# Patient Record
Sex: Male | Born: 1935
Health system: Southern US, Community
[De-identification: ages and names within clinical notes are randomized; demographics above are authoritative.]

## PROBLEM LIST (undated history)

## (undated) DIAGNOSIS — E7800 Pure hypercholesterolemia, unspecified: Secondary | ICD-10-CM

## (undated) DIAGNOSIS — G5603 Carpal tunnel syndrome, bilateral upper limbs: Principal | ICD-10-CM

## (undated) DIAGNOSIS — H409 Unspecified glaucoma: Secondary | ICD-10-CM

## (undated) DIAGNOSIS — K219 Gastro-esophageal reflux disease without esophagitis: Secondary | ICD-10-CM

## (undated) HISTORY — DX: Pure hypercholesterolemia, unspecified: E78.00

## (undated) HISTORY — DX: Carpal tunnel syndrome, bilateral upper limbs: G56.03

## (undated) HISTORY — PX: CATARACT EXTRACTION: SUR2

---

## 1997-09-20 ENCOUNTER — Ambulatory Visit (HOSPITAL_COMMUNITY): Admission: RE | Admit: 1997-09-20 | Discharge: 1997-09-20 | Payer: Self-pay | Admitting: *Deleted

## 1997-12-24 ENCOUNTER — Encounter: Payer: Self-pay | Admitting: Emergency Medicine

## 1997-12-24 ENCOUNTER — Emergency Department (HOSPITAL_COMMUNITY): Admission: EM | Admit: 1997-12-24 | Discharge: 1997-12-24 | Payer: Self-pay | Admitting: Emergency Medicine

## 1999-10-03 ENCOUNTER — Inpatient Hospital Stay (HOSPITAL_COMMUNITY): Admission: EM | Admit: 1999-10-03 | Discharge: 1999-10-08 | Payer: Self-pay

## 1999-10-03 ENCOUNTER — Encounter: Payer: Self-pay | Admitting: General Surgery

## 1999-10-04 ENCOUNTER — Encounter: Payer: Self-pay | Admitting: General Surgery

## 1999-10-05 ENCOUNTER — Encounter: Payer: Self-pay | Admitting: General Surgery

## 2001-05-11 ENCOUNTER — Encounter: Payer: Self-pay | Admitting: Occupational Medicine

## 2001-05-11 ENCOUNTER — Encounter: Admission: RE | Admit: 2001-05-11 | Discharge: 2001-05-11 | Payer: Self-pay | Admitting: Occupational Medicine

## 2001-12-14 ENCOUNTER — Encounter: Payer: Self-pay | Admitting: Orthopedic Surgery

## 2001-12-14 ENCOUNTER — Encounter: Admission: RE | Admit: 2001-12-14 | Discharge: 2001-12-14 | Payer: Self-pay | Admitting: Orthopedic Surgery

## 2003-04-16 ENCOUNTER — Ambulatory Visit (HOSPITAL_COMMUNITY): Admission: RE | Admit: 2003-04-16 | Discharge: 2003-04-16 | Payer: Self-pay | Admitting: Gastroenterology

## 2004-07-21 ENCOUNTER — Encounter: Admission: RE | Admit: 2004-07-21 | Discharge: 2004-07-21 | Payer: Self-pay | Admitting: Internal Medicine

## 2004-09-02 ENCOUNTER — Ambulatory Visit (HOSPITAL_COMMUNITY): Admission: RE | Admit: 2004-09-02 | Discharge: 2004-09-02 | Payer: Self-pay | Admitting: Gastroenterology

## 2004-10-10 ENCOUNTER — Emergency Department: Payer: Self-pay | Admitting: Emergency Medicine

## 2004-11-24 ENCOUNTER — Encounter: Admission: RE | Admit: 2004-11-24 | Discharge: 2004-11-24 | Payer: Self-pay | Admitting: Internal Medicine

## 2005-09-02 ENCOUNTER — Encounter: Admission: RE | Admit: 2005-09-02 | Discharge: 2005-09-02 | Payer: Self-pay | Admitting: Internal Medicine

## 2011-10-10 ENCOUNTER — Encounter (HOSPITAL_COMMUNITY): Payer: Self-pay | Admitting: Emergency Medicine

## 2011-10-10 ENCOUNTER — Emergency Department (HOSPITAL_COMMUNITY)
Admission: EM | Admit: 2011-10-10 | Discharge: 2011-10-11 | Disposition: A | Discharge status: Home / Self Care | Payer: Medicare Other | Attending: Emergency Medicine | Admitting: Emergency Medicine

## 2011-10-10 DIAGNOSIS — H409 Unspecified glaucoma: Secondary | ICD-10-CM | POA: Insufficient documentation

## 2011-10-10 DIAGNOSIS — K219 Gastro-esophageal reflux disease without esophagitis: Secondary | ICD-10-CM | POA: Insufficient documentation

## 2011-10-10 DIAGNOSIS — J029 Acute pharyngitis, unspecified: Secondary | ICD-10-CM | POA: Insufficient documentation

## 2011-10-10 DIAGNOSIS — J36 Peritonsillar abscess: Secondary | ICD-10-CM | POA: Insufficient documentation

## 2011-10-10 DIAGNOSIS — R509 Fever, unspecified: Secondary | ICD-10-CM | POA: Insufficient documentation

## 2011-10-10 HISTORY — DX: Gastro-esophageal reflux disease without esophagitis: K21.9

## 2011-10-10 HISTORY — DX: Unspecified glaucoma: H40.9

## 2011-10-10 LAB — RAPID STREP SCREEN (MED CTR MEBANE ONLY): Streptococcus, Group A Screen (Direct): NEGATIVE

## 2011-10-10 NOTE — ED Notes (Addendum)
Pt reporting mucus in back of throat and difficulty talking; reports pain for several days; denies nasal congestion or coughing. Reports he has been running fevers and had some headaches; denies SOB, chest pain. Glands swollen and tender.

## 2011-10-11 DIAGNOSIS — J36 Peritonsillar abscess: Secondary | ICD-10-CM | POA: Diagnosis present

## 2011-10-11 LAB — POCT I-STAT, CHEM 8
BUN: 9 mg/dL (ref 6–23)
Calcium, Ion: 1.2 mmol/L (ref 1.13–1.30)
Chloride: 102 mEq/L (ref 96–112)
HCT: 42 % (ref 39.0–52.0)
Sodium: 137 mEq/L (ref 135–145)
TCO2: 26 mmol/L (ref 0–100)

## 2011-10-11 LAB — CBC WITH DIFFERENTIAL/PLATELET
Basophils Absolute: 0 10*3/uL (ref 0.0–0.1)
Basophils Relative: 0 % (ref 0–1)
Lymphocytes Relative: 16 % (ref 12–46)
MCHC: 33 g/dL (ref 30.0–36.0)
Monocytes Absolute: 0.6 10*3/uL (ref 0.1–1.0)
Neutro Abs: 9.1 10*3/uL — ABNORMAL HIGH (ref 1.7–7.7)
Platelets: 159 10*3/uL (ref 150–400)
RDW: 11.4 % — ABNORMAL LOW (ref 11.5–15.5)
WBC: 11.5 10*3/uL — ABNORMAL HIGH (ref 4.0–10.5)

## 2011-10-11 MED ORDER — FENTANYL CITRATE 0.05 MG/ML IJ SOLN
50.0000 ug | Freq: Once | INTRAMUSCULAR | Status: AC
  Administered 2011-10-11: 50 ug via INTRAVENOUS
  Filled 2011-10-11: qty 2

## 2011-10-11 MED ORDER — AMOXICILLIN-POT CLAVULANATE 250-62.5 MG/5ML PO SUSR: 10.0000 mL | Freq: Two times a day (BID) | ORAL | Status: DC

## 2011-10-11 MED ORDER — CLINDAMYCIN PHOSPHATE 600 MG/50ML IV SOLN
600.0000 mg | Freq: Once | INTRAVENOUS | Status: AC
  Administered 2011-10-11: 600 mg via INTRAVENOUS
  Filled 2011-10-11: qty 50

## 2011-10-11 MED ORDER — DEXAMETHASONE SODIUM PHOSPHATE 4 MG/ML IJ SOLN
4.0000 mg | Freq: Once | INTRAMUSCULAR | Status: AC
  Administered 2011-10-11: 4 mg via INTRAVENOUS
  Filled 2011-10-11: qty 1

## 2011-10-11 MED ORDER — SODIUM CHLORIDE 0.9 % IV BOLUS (SEPSIS)
500.0000 mL | Freq: Once | INTRAVENOUS | Status: AC
  Administered 2011-10-11: 500 mL via INTRAVENOUS

## 2011-10-11 MED ORDER — HYDROCODONE-ACETAMINOPHEN 7.5-500 MG/15ML PO SOLN: ORAL | Status: DC

## 2011-10-11 NOTE — ED Provider Notes (Signed)
History     CSN: [id number]  Arrival date & time 10/10/11  2307   First MD Initiated Contact with Patient 10/11/11 0130      Chief Complaint  Patient presents with  . Sore Throat    (Consider location/radiation/quality/duration/timing/severity/associated sxs/prior treatment) Patient is a 78 y.o. male presenting with pharyngitis. The history is provided by the patient.  Sore Throat Pertinent negatives include no chest pain, no abdominal pain and no shortness of breath.   patient presents with sore throat and fever for the last few days. Worse on the left side. Worse with swallowing. No clear sick contacts. No cough. She's had some trouble eating due to the fever. He's not had pain like this before. No trouble breathing.  Past Medical History  Diagnosis Date  . GERD (gastroesophageal reflux disease)   . Glaucoma     No past surgical history on file.  No family history on file.  History  Substance Use Topics  . Smoking status: Not on file  . Smokeless tobacco: Not on file  . Alcohol Use:       Review of Systems  Constitutional: Positive for fever and fatigue. Negative for appetite change.  HENT: Positive for sore throat and trouble swallowing. Negative for drooling, dental problem and voice change.   Respiratory: Negative for cough and shortness of breath.   Cardiovascular: Negative for chest pain.  Gastrointestinal: Positive for nausea. Negative for vomiting and abdominal pain.  Genitourinary: Negative for flank pain.  Musculoskeletal: Negative for back pain.    Allergies  Review of patient's allergies indicates no known allergies.  Home Medications   Current Outpatient Rx  Name Route Sig Dispense Refill  . PANTOPRAZOLE SODIUM 40 MG PO TBEC Oral Take 40 mg by mouth daily.    Ronney Asters SINUS CONGESTION/PAIN PO Oral Take 2 tablets by mouth 2 (two) times daily as needed. For cold symptoms      BP 123/64  Pulse 85  Temp 99.4 F (37.4 C) (Oral)  Resp 20  Ht  5\' 5"  (1.651 m)  Wt 145 lb (65.772 kg)  BMI 24.13 kg/m2  SpO2 97%  Physical Exam  Constitutional: He appears well-developed and well-nourished.  HENT:  Head: Normocephalic.       Left-sided peritonsillar swelling. Likely peritonsillar abscess. Uvula midline. No tonsillar exudate.  Eyes: Pupils are equal, round, and reactive to light.  Neck: No tracheal deviation present.  Cardiovascular: Normal rate.   Pulmonary/Chest: Effort normal and breath sounds normal.  Abdominal: Soft. There is no tenderness.  Lymphadenopathy:    He has cervical adenopathy.  Neurological: He is alert.    ED Course  Procedures (including critical care time)  Labs Reviewed  CBC WITH DIFFERENTIAL - Abnormal; Notable for the following:    WBC 11.5 (*)     RDW 11.4 (*)     Neutrophils Relative 78 (*)     Neutro Abs 9.1 (*)     All other components within normal limits  POCT I-STAT, CHEM 8 - Abnormal; Notable for the following:    Glucose, Bld 121 (*)     All other components within normal limits  RAPID STREP SCREEN   No results found.   1. Peritonsillar abscess       MDM  Patient with apparent peritonsillar abscess. Given clindamycin and steroids. Discussion with Dr. Annalee Genta. He'll see the patient in ER tomorrow morning and likely drain the abscess.        Juliet Rude. Rubin Payor, MD 10/11/11 [phone]

## 2011-10-11 NOTE — ED Notes (Signed)
Dr. Annalee Genta called RN, stated he was on the way to see patient.

## 2011-10-11 NOTE — Consult Note (Signed)
ENT CONSULT:  Reason for Consult:Sore throat Referring Physician: EDP  LAKEITH CAREAGA is an 78 y.o. male.  HPI: 3 day hx of prog ST, poor po intake and fever, no sig hx of tonsillitis  Past Medical History  Diagnosis Date  . GERD (gastroesophageal reflux disease)   . Glaucoma     No past surgical history on file.  No family history on file.  Social History:  does not have a smoking history on file. He does not have any smokeless tobacco history on file. His alcohol and drug histories not on file.  Allergies: No Known Allergies  Medications: I have reviewed the patient's current medications.  Results for orders placed during the hospital encounter of 10/10/11 (from the past 48 hour(s))  RAPID STREP SCREEN     Status: Normal   Collection Time   10/10/11 11:35 PM      Component Value Range Comment   Streptococcus, Group A Screen (Direct) NEGATIVE  NEGATIVE   CBC WITH DIFFERENTIAL     Status: Abnormal   Collection Time   10/11/11  2:29 AM      Component Value Range Comment   WBC 11.5 (*) 4.0 - 10.5 K/uL    RBC 4.26  4.22 - 5.81 MIL/uL    Hemoglobin 13.5  13.0 - 17.0 g/dL    HCT 16.1  09.6 - 04.5 %    MCV 96.0  78.0 - 100.0 fL    MCH 31.7  26.0 - 34.0 pg    MCHC 33.0  30.0 - 36.0 g/dL    RDW 40.9 (*) 81.1 - 15.5 %    Platelets 159  150 - 400 K/uL    Neutrophils Relative 78 (*) 43 - 77 %    Neutro Abs 9.1 (*) 1.7 - 7.7 K/uL    Lymphocytes Relative 16  12 - 46 %    Lymphs Abs 1.8  0.7 - 4.0 K/uL    Monocytes Relative 6  3 - 12 %    Monocytes Absolute 0.6  0.1 - 1.0 K/uL    Eosinophils Relative 0  0 - 5 %    Eosinophils Absolute 0.0  0.0 - 0.7 K/uL    Basophils Relative 0  0 - 1 %    Basophils Absolute 0.0  0.0 - 0.1 K/uL   POCT I-STAT, CHEM 8     Status: Abnormal   Collection Time   10/11/11  2:48 AM      Component Value Range Comment   Sodium 137  135 - 145 mEq/L    Potassium 4.5  3.5 - 5.1 mEq/L    Chloride 102  96 - 112 mEq/L    BUN 9  6 - 23 mg/dL    Creatinine, Ser 9.14  0.50 - 1.35 mg/dL    Glucose, Bld 782 (*) 70 - 99 mg/dL    Calcium, Ion 9.56  2.13 - 1.30 mmol/L    TCO2 26  0 - 100 mmol/L    Hemoglobin 14.3  13.0 - 17.0 g/dL    HCT 08.6  57.8 - 46.9 %     No results found.  ROS:per Adm    Blood pressure 123/64, pulse 85, temperature 99.4 F (37.4 C), temperature source Oral, resp. rate 20, height 5\' 5"  (1.651 m), weight 65.772 kg (145 lb), SpO2 97.00%.  PHYSICAL EXAM: General appearance - alert, well appearing, and in no distress and oriented to person, place, and time Mouth - edentulous, tongue normal and Left PTA Neck -  supple, no significant adenopathy  Procedure: I&D Lt PTA Local/topical anesth No comp    Studies Reviewed:None  Assessment/Plan: Acute tonsillitis and Lt PTA. D/C to home with RX's, tonsil ptx and f/u in 2 wks.  Serenity Batley 10/11/2011, 7:24 AM

## 2011-10-11 NOTE — ED Notes (Signed)
Report given to [employer]. Josh NT transported pt. To Pod A 11

## 2011-11-12 ENCOUNTER — Other Ambulatory Visit: Payer: Self-pay | Admitting: Internal Medicine

## 2011-11-12 DIAGNOSIS — R1310 Dysphagia, unspecified: Secondary | ICD-10-CM

## 2011-11-17 ENCOUNTER — Ambulatory Visit
Admission: RE | Admit: 2011-11-17 | Discharge: 2011-11-17 | Disposition: A | Discharge status: Home / Self Care | Payer: Medicare Other | Source: Ambulatory Visit | Attending: Internal Medicine | Admitting: Internal Medicine

## 2011-11-17 DIAGNOSIS — R1310 Dysphagia, unspecified: Secondary | ICD-10-CM

## 2014-03-21 DIAGNOSIS — R5383 Other fatigue: Secondary | ICD-10-CM | POA: Diagnosis not present

## 2014-06-21 DIAGNOSIS — H25012 Cortical age-related cataract, left eye: Secondary | ICD-10-CM | POA: Diagnosis not present

## 2014-06-21 DIAGNOSIS — Z961 Presence of intraocular lens: Secondary | ICD-10-CM | POA: Diagnosis not present

## 2014-06-21 DIAGNOSIS — H4011X3 Primary open-angle glaucoma, severe stage: Secondary | ICD-10-CM | POA: Diagnosis not present

## 2014-10-10 DIAGNOSIS — R413 Other amnesia: Secondary | ICD-10-CM | POA: Diagnosis not present

## 2014-10-10 DIAGNOSIS — N401 Enlarged prostate with lower urinary tract symptoms: Secondary | ICD-10-CM | POA: Diagnosis not present

## 2014-10-10 DIAGNOSIS — R319 Hematuria, unspecified: Secondary | ICD-10-CM | POA: Diagnosis not present

## 2014-10-17 DIAGNOSIS — R319 Hematuria, unspecified: Secondary | ICD-10-CM | POA: Diagnosis not present

## 2014-10-18 DIAGNOSIS — R413 Other amnesia: Secondary | ICD-10-CM | POA: Diagnosis not present

## 2014-10-18 DIAGNOSIS — E538 Deficiency of other specified B group vitamins: Secondary | ICD-10-CM | POA: Diagnosis not present

## 2014-10-18 DIAGNOSIS — R319 Hematuria, unspecified: Secondary | ICD-10-CM | POA: Diagnosis not present

## 2014-10-24 DIAGNOSIS — H401111 Primary open-angle glaucoma, right eye, mild stage: Secondary | ICD-10-CM | POA: Diagnosis not present

## 2014-10-24 DIAGNOSIS — H401123 Primary open-angle glaucoma, left eye, severe stage: Secondary | ICD-10-CM | POA: Diagnosis not present

## 2014-10-24 DIAGNOSIS — R319 Hematuria, unspecified: Secondary | ICD-10-CM | POA: Diagnosis not present

## 2014-11-12 DIAGNOSIS — M25512 Pain in left shoulder: Secondary | ICD-10-CM | POA: Diagnosis not present

## 2014-11-12 DIAGNOSIS — M25562 Pain in left knee: Secondary | ICD-10-CM | POA: Diagnosis not present

## 2014-11-12 DIAGNOSIS — N400 Enlarged prostate without lower urinary tract symptoms: Secondary | ICD-10-CM | POA: Diagnosis not present

## 2014-11-12 DIAGNOSIS — E782 Mixed hyperlipidemia: Secondary | ICD-10-CM | POA: Diagnosis not present

## 2014-11-12 DIAGNOSIS — Z0000 Encounter for general adult medical examination without abnormal findings: Secondary | ICD-10-CM | POA: Diagnosis not present

## 2014-11-12 DIAGNOSIS — Z1389 Encounter for screening for other disorder: Secondary | ICD-10-CM | POA: Diagnosis not present

## 2015-01-03 DIAGNOSIS — R351 Nocturia: Secondary | ICD-10-CM | POA: Diagnosis not present

## 2015-01-03 DIAGNOSIS — R3121 Asymptomatic microscopic hematuria: Secondary | ICD-10-CM | POA: Diagnosis not present

## 2015-01-10 DIAGNOSIS — R3129 Other microscopic hematuria: Secondary | ICD-10-CM | POA: Diagnosis not present

## 2015-01-10 DIAGNOSIS — R3121 Asymptomatic microscopic hematuria: Secondary | ICD-10-CM | POA: Diagnosis not present

## 2015-01-15 DIAGNOSIS — H401123 Primary open-angle glaucoma, left eye, severe stage: Secondary | ICD-10-CM | POA: Diagnosis not present

## 2015-01-15 DIAGNOSIS — Z961 Presence of intraocular lens: Secondary | ICD-10-CM | POA: Diagnosis not present

## 2015-01-15 DIAGNOSIS — H401111 Primary open-angle glaucoma, right eye, mild stage: Secondary | ICD-10-CM | POA: Diagnosis not present

## 2015-01-15 DIAGNOSIS — H25811 Combined forms of age-related cataract, right eye: Secondary | ICD-10-CM | POA: Diagnosis not present

## 2015-01-24 DIAGNOSIS — R3121 Asymptomatic microscopic hematuria: Secondary | ICD-10-CM | POA: Diagnosis not present

## 2015-01-24 DIAGNOSIS — Z0000 Encounter for general adult medical examination without abnormal findings: Secondary | ICD-10-CM | POA: Diagnosis not present

## 2015-03-20 DIAGNOSIS — H401123 Primary open-angle glaucoma, left eye, severe stage: Secondary | ICD-10-CM | POA: Diagnosis not present

## 2015-03-20 DIAGNOSIS — Z961 Presence of intraocular lens: Secondary | ICD-10-CM | POA: Diagnosis not present

## 2015-03-20 DIAGNOSIS — H401112 Primary open-angle glaucoma, right eye, moderate stage: Secondary | ICD-10-CM | POA: Diagnosis not present

## 2015-03-20 DIAGNOSIS — H25811 Combined forms of age-related cataract, right eye: Secondary | ICD-10-CM | POA: Diagnosis not present

## 2015-05-15 DIAGNOSIS — Z961 Presence of intraocular lens: Secondary | ICD-10-CM | POA: Diagnosis not present

## 2015-05-15 DIAGNOSIS — H401123 Primary open-angle glaucoma, left eye, severe stage: Secondary | ICD-10-CM | POA: Diagnosis not present

## 2015-05-15 DIAGNOSIS — H25811 Combined forms of age-related cataract, right eye: Secondary | ICD-10-CM | POA: Diagnosis not present

## 2015-05-15 DIAGNOSIS — H401112 Primary open-angle glaucoma, right eye, moderate stage: Secondary | ICD-10-CM | POA: Diagnosis not present

## 2015-06-04 DIAGNOSIS — N400 Enlarged prostate without lower urinary tract symptoms: Secondary | ICD-10-CM | POA: Diagnosis not present

## 2015-06-04 DIAGNOSIS — R911 Solitary pulmonary nodule: Secondary | ICD-10-CM | POA: Diagnosis not present

## 2015-06-04 DIAGNOSIS — R319 Hematuria, unspecified: Secondary | ICD-10-CM | POA: Diagnosis not present

## 2015-06-18 DIAGNOSIS — H2511 Age-related nuclear cataract, right eye: Secondary | ICD-10-CM | POA: Diagnosis not present

## 2015-06-24 DIAGNOSIS — H2511 Age-related nuclear cataract, right eye: Secondary | ICD-10-CM | POA: Diagnosis not present

## 2015-06-24 DIAGNOSIS — H25811 Combined forms of age-related cataract, right eye: Secondary | ICD-10-CM | POA: Diagnosis not present

## 2015-07-17 DIAGNOSIS — R1032 Left lower quadrant pain: Secondary | ICD-10-CM | POA: Diagnosis not present

## 2015-08-07 DIAGNOSIS — R1030 Lower abdominal pain, unspecified: Secondary | ICD-10-CM | POA: Diagnosis not present

## 2015-08-08 DIAGNOSIS — Z961 Presence of intraocular lens: Secondary | ICD-10-CM | POA: Diagnosis not present

## 2015-09-20 DIAGNOSIS — H1132 Conjunctival hemorrhage, left eye: Secondary | ICD-10-CM | POA: Diagnosis not present

## 2015-10-11 DIAGNOSIS — Z23 Encounter for immunization: Secondary | ICD-10-CM | POA: Diagnosis not present

## 2015-10-11 DIAGNOSIS — F320 Major depressive disorder, single episode, mild: Secondary | ICD-10-CM | POA: Diagnosis not present

## 2015-10-11 DIAGNOSIS — R1032 Left lower quadrant pain: Secondary | ICD-10-CM | POA: Diagnosis not present

## 2015-11-01 DIAGNOSIS — H401123 Primary open-angle glaucoma, left eye, severe stage: Secondary | ICD-10-CM | POA: Diagnosis not present

## 2015-11-01 DIAGNOSIS — H26493 Other secondary cataract, bilateral: Secondary | ICD-10-CM | POA: Diagnosis not present

## 2015-11-01 DIAGNOSIS — Z961 Presence of intraocular lens: Secondary | ICD-10-CM | POA: Diagnosis not present

## 2015-11-01 DIAGNOSIS — H401112 Primary open-angle glaucoma, right eye, moderate stage: Secondary | ICD-10-CM | POA: Diagnosis not present

## 2015-11-15 DIAGNOSIS — Z961 Presence of intraocular lens: Secondary | ICD-10-CM | POA: Diagnosis not present

## 2015-11-15 DIAGNOSIS — H26491 Other secondary cataract, right eye: Secondary | ICD-10-CM | POA: Diagnosis not present

## 2015-12-02 DIAGNOSIS — N400 Enlarged prostate without lower urinary tract symptoms: Secondary | ICD-10-CM | POA: Diagnosis not present

## 2015-12-02 DIAGNOSIS — Z1389 Encounter for screening for other disorder: Secondary | ICD-10-CM | POA: Diagnosis not present

## 2015-12-02 DIAGNOSIS — Z0000 Encounter for general adult medical examination without abnormal findings: Secondary | ICD-10-CM | POA: Diagnosis not present

## 2015-12-02 DIAGNOSIS — E7800 Pure hypercholesterolemia, unspecified: Secondary | ICD-10-CM | POA: Diagnosis not present

## 2015-12-04 DIAGNOSIS — Z961 Presence of intraocular lens: Secondary | ICD-10-CM | POA: Diagnosis not present

## 2015-12-04 DIAGNOSIS — H26492 Other secondary cataract, left eye: Secondary | ICD-10-CM | POA: Diagnosis not present

## 2015-12-13 DIAGNOSIS — E7800 Pure hypercholesterolemia, unspecified: Secondary | ICD-10-CM | POA: Diagnosis not present

## 2015-12-17 DIAGNOSIS — N400 Enlarged prostate without lower urinary tract symptoms: Secondary | ICD-10-CM | POA: Diagnosis not present

## 2015-12-31 DIAGNOSIS — M79642 Pain in left hand: Secondary | ICD-10-CM | POA: Diagnosis not present

## 2016-01-09 DIAGNOSIS — H401112 Primary open-angle glaucoma, right eye, moderate stage: Secondary | ICD-10-CM | POA: Diagnosis not present

## 2016-01-09 DIAGNOSIS — H04123 Dry eye syndrome of bilateral lacrimal glands: Secondary | ICD-10-CM | POA: Diagnosis not present

## 2016-01-09 DIAGNOSIS — H401123 Primary open-angle glaucoma, left eye, severe stage: Secondary | ICD-10-CM | POA: Diagnosis not present

## 2016-01-16 ENCOUNTER — Other Ambulatory Visit: Payer: Self-pay | Admitting: Internal Medicine

## 2016-01-16 DIAGNOSIS — R911 Solitary pulmonary nodule: Secondary | ICD-10-CM | POA: Diagnosis not present

## 2016-01-20 ENCOUNTER — Ambulatory Visit
Admission: RE | Admit: 2016-01-20 | Discharge: 2016-01-20 | Disposition: A | Discharge status: Home / Self Care | Payer: Medicare HMO | Source: Ambulatory Visit | Attending: Internal Medicine | Admitting: Internal Medicine

## 2016-01-20 DIAGNOSIS — R911 Solitary pulmonary nodule: Secondary | ICD-10-CM

## 2016-01-20 DIAGNOSIS — R918 Other nonspecific abnormal finding of lung field: Secondary | ICD-10-CM | POA: Diagnosis not present

## 2016-03-10 ENCOUNTER — Ambulatory Visit (INDEPENDENT_AMBULATORY_CARE_PROVIDER_SITE_OTHER): Payer: Medicare HMO | Admitting: Neurology

## 2016-03-10 ENCOUNTER — Encounter: Payer: Self-pay | Admitting: Neurology

## 2016-03-10 VITALS — BP 132/62 | HR 47 | Ht 65.0 in | Wt 147.0 lb

## 2016-03-10 DIAGNOSIS — G4489 Other headache syndrome: Secondary | ICD-10-CM | POA: Diagnosis not present

## 2016-03-10 NOTE — Patient Instructions (Signed)
   We will check MRI of the brain and get a carotid doppler study to look at the blood circulation to the brain. We will get blood work today.

## 2016-03-10 NOTE — Progress Notes (Signed)
Reason for visit: Headache  Referring physician: Dr. Ron Parker Jim Joseph is a 83 y.o. male  History of present illness:  Jim Joseph is an 83 year old right-handed black male with a history of onset of headache that occurred sometime in November 2017. The patient indicates that he had some sort of laser procedure on both eyes, after the procedure on the left eye he began having some discomfort behind the eye that has been persistent since that time. The patient does have some occasional discomfort behind the ear on the left, he denies any sharp shooting pains in the back of the head or any neck discomfort. The patient has not had any other symptoms of numbness or weakness of the extremities, vision changes, or double vision. He denies any pre-existing history of headaches throughout his life. He does have some urinary frequency but he does drink a lot of fluids. He denies any dizziness problems. The patient is also bothered with numbness of the left hand, he was told he had carpal tunnel syndrome. The patient may wake up at night with the discomfort in the left hand. He has been wearing a wrist splint off and on. He is sent to this office for further evaluation of the headache.  Past Medical History:  Diagnosis Date  . GERD (gastroesophageal reflux disease)   . Glaucoma   . High cholesterol     No past surgical history on file.  History reviewed. No pertinent family history.  Social history:  reports that he has never smoked. He has never used smokeless tobacco. He reports that he drinks alcohol. He reports that he does not use drugs.  Medications:  Prior to Admission medications   Medication Sig Start Date End Date Taking? Authorizing Provider  COD LIVER OIL PO Take 1 capsule by mouth daily.   Yes Historical Provider, MD  latanoprost (XALATAN) 0.005 % ophthalmic solution Place 1 drop into both eyes at bedtime. 02/06/16  Yes Historical Provider, MD  tamsulosin (FLOMAX) 0.4 MG CAPS  capsule Take 0.4 mg by mouth.   Yes Historical Provider, MD     No Known Allergies  ROS:  Out of a complete 14 system review of symptoms, the patient complains only of the following symptoms, and all other reviewed systems are negative.  Blurred vision, loss of vision, eye pain Hearing loss, ringing in the ears Moles Urination problems, blood in the urine Allergies, runny nose Headache, numbness, dizziness  Blood pressure 132/62, pulse (!) 47, height 5\' 5"  (1.651 m), weight 147 lb (66.7 kg), SpO2 97 %.  Physical Exam  General: The patient is alert and cooperative at the time of the examination.  Eyes: Pupils are equal, round, and reactive to light. Discs are flat bilaterally.  Neck: The neck is supple, no carotid bruits are noted.  Respiratory: The respiratory examination is clear.  Cardiovascular: The cardiovascular examination reveals a regular rate and rhythm, no obvious murmurs or rubs are noted.  Neuromuscular: The patient lacks about 15 of full lateral rotation of the cervical spine bilaterally. No crepitus is noted in the temporomandibular joints on either side.  Skin: Extremities are without significant edema.  Neurologic Exam  Mental status: The patient is alert and oriented x 3 at the time of the examination. The patient has apparent normal recent and remote memory, with an apparently normal attention span and concentration ability.  Cranial nerves: Facial symmetry is present. There is good sensation of the face to pinprick and soft touch bilaterally.  The strength of the facial muscles and the muscles to head turning and shoulder shrug are normal bilaterally. Speech is well enunciated, no aphasia or dysarthria is noted. Extraocular movements are full. Visual fields are full. The tongue is midline, and the patient has symmetric elevation of the soft palate. No obvious hearing deficits are noted.  Motor: The motor testing reveals 5 over 5 strength of all 4  extremities, with exception of slight weakness of the APB muscle on the left. Good symmetric motor tone is noted throughout.  Sensory: Sensory testing is intact to pinprick, soft touch, vibration sensation, and position sense on all 4 extremities. No evidence of extinction is noted.  Coordination: Cerebellar testing reveals good finger-nose-finger and heel-to-shin bilaterally. Mild apraxia the lower extremities is seen. The patient has a positive Tinel's sign with the left wrist, negative on the right.  Gait and station: Gait is normal. Tandem gait is normal. Romberg is negative. No drift is seen.  Reflexes: Deep tendon reflexes are symmetric and normal bilaterally. Toes are downgoing bilaterally.   Assessment/Plan:  1. Headache, new onset  2. Probable left carpal tunnel syndrome  The patient has developed a new headache around the left eye. The patient will be sent for further evaluation. He will be set up for blood work today to check a sedimentation rate and C-reactive protein. He will have MRI of the brain and a carotid Doppler study. He will follow-up in about 3 months, we may consider a nerve conduction study to further evaluate the possible left carpal tunnel syndrome.  Jill Alexanders MD 03/10/2016 10:27 AM  [facility] [address] [place], [place] [address]  Phone [phone] Fax [phone]

## 2016-03-11 ENCOUNTER — Ambulatory Visit (INDEPENDENT_AMBULATORY_CARE_PROVIDER_SITE_OTHER): Payer: Medicare HMO

## 2016-03-11 ENCOUNTER — Telehealth: Payer: Self-pay | Admitting: *Deleted

## 2016-03-11 DIAGNOSIS — G4489 Other headache syndrome: Secondary | ICD-10-CM

## 2016-03-11 LAB — SEDIMENTATION RATE: Sed Rate: 4 mm/hr (ref 0–30)

## 2016-03-11 LAB — C-REACTIVE PROTEIN: CRP: 0.3 mg/L (ref 0.0–4.9)

## 2016-03-11 NOTE — Telephone Encounter (Signed)
-----   Message from Kathrynn Ducking, MD sent at 03/11/2016  7:18 AM EST -----  The blood work results are unremarkable. Please call the patient.  ----- Message ----- From: Lavone Neri Lab Results In Sent: 03/11/2016   5:40 AM To: Kathrynn Ducking, MD

## 2016-03-11 NOTE — Telephone Encounter (Signed)
Noted, thank you

## 2016-03-11 NOTE — Telephone Encounter (Signed)
Advise patient of unremarkable labs.

## 2016-03-11 NOTE — Telephone Encounter (Signed)
Tried calling pt with results. Phone rang several times and went to automated message stating he could not be reached at this time and try call later. Unable to LVM.  *Please let pt know labs unremarkable per CW,MD note if he calls

## 2016-03-19 ENCOUNTER — Telehealth: Payer: Self-pay | Admitting: Neurology

## 2016-03-19 NOTE — Telephone Encounter (Signed)
I called patient. The carotid Doppler study is unremarkable, MRI of the brain is pending. I discussed this with the patient.

## 2016-03-27 ENCOUNTER — Ambulatory Visit
Admission: RE | Admit: 2016-03-27 | Discharge: 2016-03-27 | Disposition: A | Discharge status: Home / Self Care | Payer: Medicare HMO | Source: Ambulatory Visit | Attending: Neurology | Admitting: Neurology

## 2016-03-27 DIAGNOSIS — R51 Headache: Secondary | ICD-10-CM | POA: Diagnosis not present

## 2016-03-27 DIAGNOSIS — G4489 Other headache syndrome: Secondary | ICD-10-CM

## 2016-03-29 ENCOUNTER — Telehealth: Payer: Self-pay | Admitting: Neurology

## 2016-03-29 MED ORDER — GABAPENTIN 100 MG PO CAPS: ORAL_CAPSULE | ORAL | 3 refills | Status: DC

## 2016-03-29 NOTE — Telephone Encounter (Signed)
I called the patient. The MRI shows chronic SVD and evidence of an old left frontal ICH, ? If this has anything to do with his headache.  I will call in low dose gabapentin for the headache.   MRI brain 03/29/16:  IMPRESSION:  This MRI of the brain without contrast shows the following: 1.   Chronic left frontal hemorrhage.    2.   Chronic microvascular ischemic change. None of the foci appears to be acute. 3.   Mild chronic ethmoid sinusitis. 4.   There are no acute findings.

## 2016-04-08 DIAGNOSIS — M25312 Other instability, left shoulder: Secondary | ICD-10-CM | POA: Diagnosis not present

## 2016-04-08 DIAGNOSIS — M25512 Pain in left shoulder: Secondary | ICD-10-CM | POA: Diagnosis not present

## 2016-04-08 DIAGNOSIS — G8929 Other chronic pain: Secondary | ICD-10-CM | POA: Diagnosis not present

## 2016-04-09 DIAGNOSIS — R200 Anesthesia of skin: Secondary | ICD-10-CM | POA: Diagnosis not present

## 2016-05-07 DIAGNOSIS — H04123 Dry eye syndrome of bilateral lacrimal glands: Secondary | ICD-10-CM | POA: Diagnosis not present

## 2016-05-07 DIAGNOSIS — Z961 Presence of intraocular lens: Secondary | ICD-10-CM | POA: Diagnosis not present

## 2016-05-07 DIAGNOSIS — H401112 Primary open-angle glaucoma, right eye, moderate stage: Secondary | ICD-10-CM | POA: Diagnosis not present

## 2016-05-19 DIAGNOSIS — R350 Frequency of micturition: Secondary | ICD-10-CM | POA: Diagnosis not present

## 2016-05-19 DIAGNOSIS — N401 Enlarged prostate with lower urinary tract symptoms: Secondary | ICD-10-CM | POA: Diagnosis not present

## 2016-06-11 ENCOUNTER — Ambulatory Visit: Payer: Medicare HMO | Admitting: Adult Health

## 2016-06-11 ENCOUNTER — Ambulatory Visit (INDEPENDENT_AMBULATORY_CARE_PROVIDER_SITE_OTHER): Payer: Medicare HMO | Admitting: Adult Health

## 2016-06-11 ENCOUNTER — Encounter: Payer: Self-pay | Admitting: Adult Health

## 2016-06-11 ENCOUNTER — Encounter (INDEPENDENT_AMBULATORY_CARE_PROVIDER_SITE_OTHER): Payer: Self-pay

## 2016-06-11 VITALS — BP 109/55 | HR 89 | Ht 65.0 in | Wt 144.6 lb

## 2016-06-11 DIAGNOSIS — G4489 Other headache syndrome: Secondary | ICD-10-CM

## 2016-06-11 NOTE — Progress Notes (Signed)
PATIENT: Jim Joseph DOB: 25-Oct-1928  REASON FOR VISIT: follow up- headache HISTORY FROM: patient  HISTORY OF PRESENT ILLNESS: Jim Joseph is a 83 year old male with a history of new onset headaches. He returns today for follow-up. He states that his headaches have improved. He states that he took gabapentin for 1 month but feels that it causes diarrhea. His primary care had advised that he stop the medication and see if the diarrhea has subsided. He states that he did. Fortunately his headaches have not returned. He reports every now and then he may feel a twinge of pain on the left side of the head. He denies any additional neurological symptoms. He returns today for an evaluation.  HISTORY 03/10/16: Jim Joseph is an 83 year old right-handed black male with a history of onset of headache that occurred sometime in November 2017. The patient indicates that he had some sort of laser procedure on both eyes, after the procedure on the left eye he began having some discomfort behind the eye that has been persistent since that time. The patient does have some occasional discomfort behind the ear on the left, he denies any sharp shooting pains in the back of the head or any neck discomfort. The patient has not had any other symptoms of numbness or weakness of the extremities, vision changes, or double vision. He denies any pre-existing history of headaches throughout his life. He does have some urinary frequency but he does drink a lot of fluids. He denies any dizziness problems. The patient is also bothered with numbness of the left hand, he was told he had carpal tunnel syndrome. The patient may wake up at night with the discomfort in the left hand. He has been wearing a wrist splint off and on. He is sent to this office for further evaluation of the headache.   REVIEW OF SYSTEMS: Out of a complete 14 system review of symptoms, the patient complains only of the following symptoms, and all other reviewed  systems are negative.  See history of present illness  ALLERGIES: Allergies  Allergen Reactions  . Gabapentin Diarrhea    HOME MEDICATIONS: Outpatient Medications Prior to Visit  Medication Sig Dispense Refill  . COD LIVER OIL PO Take 1 capsule by mouth daily.    Marland Kitchen latanoprost (XALATAN) 0.005 % ophthalmic solution Place 1 drop into both eyes at bedtime.  3  . tamsulosin (FLOMAX) 0.4 MG CAPS capsule Take 0.4 mg by mouth.    . gabapentin (NEURONTIN) 100 MG capsule One capsule in the morning and 2 in the evening. (Patient not taking: Reported on 06/11/2016) 90 capsule 3   No facility-administered medications prior to visit.     PAST MEDICAL HISTORY: Past Medical History:  Diagnosis Date  . GERD (gastroesophageal reflux disease)   . Glaucoma   . High cholesterol     PAST SURGICAL HISTORY: No past surgical history on file.  FAMILY HISTORY: No family history on file.  SOCIAL HISTORY: Social History   Social History  . Marital status: Single    Spouse name: N/A  . Number of children: N/A  . Years of education: GED   Occupational History  . Retired    Social History Main Topics  . Smoking status: Never Smoker  . Smokeless tobacco: Never Used  . Alcohol use Yes     Comment: ocass  . Drug use: No  . Sexual activity: Not on file   Other Topics Concern  . Not on file  Social History Narrative   Lives alone   Caffeine use: about 1 cup       PHYSICAL EXAM  Vitals:   06/11/16 1004  BP: (!) 109/55  Pulse: 89  Weight: 144 lb 9.6 oz (65.6 kg)  Height: 5\' 5"  (1.651 m)   Body mass index is 24.06 kg/m.  Generalized: Well developed, in no acute distress   Neurological examination  Mentation: Alert oriented to time, place, history taking. Follows all commands speech and language fluent Cranial nerve II-XII: Pupils were equal round reactive to light. Extraocular movements were full, visual field were full on confrontational test. Facial sensation and strength  were normal. Uvula tongue midline. Head turning and shoulder shrug  were normal and symmetric. Motor: The motor testing reveals 5 over 5 strength of all 4 extremities. Good symmetric motor tone is noted throughout.  Sensory: Sensory testing is intact to soft touch on all 4 extremities. No evidence of extinction is noted.  Coordination: Cerebellar testing reveals good finger-nose-finger and heel-to-shin bilaterally.  Gait and station: Gait is normal.   Reflexes: Deep tendon reflexes are symmetric and normal bilaterally.   DIAGNOSTIC DATA (LABS, IMAGING, TESTING) - I reviewed patient records, labs, notes, testing and imaging myself where available.   ASSESSMENT AND PLAN 83 y.o. year old male  has a past medical history of GERD (gastroesophageal reflux disease); Glaucoma; and High cholesterol. here with:  1. Headaches  The patient's headaches have improved. He is currently not taking any medication for his headaches. I have advised that if his headaches return and we can consider restarting gabapentin at a lower dose. Patient verbalized understanding. He will follow up with our office on an as-needed basis.  I spent 15 minutes with the patient 50% of this time was spent counseling the patient on his medication.     Ward Givens, MSN, NP-C 06/11/2016, 10:24 AM [facility] [address], [place], [place] [address] [phone]

## 2016-06-11 NOTE — Patient Instructions (Signed)
If headaches return please call our office.  If they do return we will consider restarting gabapentin at a lower dose If your symptoms worsen or you develop new symptoms please let us know.

## 2016-06-11 NOTE — Progress Notes (Signed)
I have read the note, and I agree with the clinical assessment and plan.  Jim Joseph,Jim Joseph   

## 2016-06-29 DIAGNOSIS — M25512 Pain in left shoulder: Secondary | ICD-10-CM | POA: Diagnosis not present

## 2016-06-29 DIAGNOSIS — G8929 Other chronic pain: Secondary | ICD-10-CM | POA: Diagnosis not present

## 2016-07-02 DIAGNOSIS — Z961 Presence of intraocular lens: Secondary | ICD-10-CM | POA: Diagnosis not present

## 2016-07-02 DIAGNOSIS — H401112 Primary open-angle glaucoma, right eye, moderate stage: Secondary | ICD-10-CM | POA: Diagnosis not present

## 2016-07-02 DIAGNOSIS — G44209 Tension-type headache, unspecified, not intractable: Secondary | ICD-10-CM | POA: Diagnosis not present

## 2016-07-02 DIAGNOSIS — H401123 Primary open-angle glaucoma, left eye, severe stage: Secondary | ICD-10-CM | POA: Diagnosis not present

## 2016-07-22 DIAGNOSIS — M25312 Other instability, left shoulder: Secondary | ICD-10-CM | POA: Diagnosis not present

## 2016-09-28 ENCOUNTER — Telehealth: Payer: Self-pay | Admitting: Adult Health

## 2016-09-28 NOTE — Telephone Encounter (Signed)
Pt called in, he was confusing to talk with and talked very fast. He said when he was at last [facility] in May, he was told he had a stroke. He doesn't remember having a stroke and wants to discuss it. Please call

## 2016-09-28 NOTE — Telephone Encounter (Signed)
Called patient to discuss. He insisted that he has a bill from this office that states he has had a stroke. This RN reviewed notes from Dr Jannifer Franklin and Edman Circle, NP and advised the patient there is no mention of a stroke. The patient continued to voice concern and stated he has never had a stroke. This RN advised he bring the paper by for this RN to see what he is referring to. Patient verbalized understanding, agreement.

## 2016-10-02 DIAGNOSIS — R51 Headache: Secondary | ICD-10-CM | POA: Diagnosis not present

## 2016-10-02 DIAGNOSIS — N401 Enlarged prostate with lower urinary tract symptoms: Secondary | ICD-10-CM | POA: Diagnosis not present

## 2016-10-05 DIAGNOSIS — M25512 Pain in left shoulder: Secondary | ICD-10-CM | POA: Diagnosis not present

## 2016-11-04 DIAGNOSIS — H401113 Primary open-angle glaucoma, right eye, severe stage: Secondary | ICD-10-CM | POA: Diagnosis not present

## 2016-11-04 DIAGNOSIS — H401112 Primary open-angle glaucoma, right eye, moderate stage: Secondary | ICD-10-CM | POA: Diagnosis not present

## 2016-12-16 DIAGNOSIS — M25512 Pain in left shoulder: Secondary | ICD-10-CM | POA: Diagnosis not present

## 2016-12-16 DIAGNOSIS — G8929 Other chronic pain: Secondary | ICD-10-CM | POA: Diagnosis not present

## 2016-12-16 DIAGNOSIS — G5602 Carpal tunnel syndrome, left upper limb: Secondary | ICD-10-CM | POA: Diagnosis not present

## 2016-12-16 DIAGNOSIS — M25312 Other instability, left shoulder: Secondary | ICD-10-CM | POA: Diagnosis not present

## 2016-12-31 DIAGNOSIS — Z0000 Encounter for general adult medical examination without abnormal findings: Secondary | ICD-10-CM | POA: Diagnosis not present

## 2016-12-31 DIAGNOSIS — E7800 Pure hypercholesterolemia, unspecified: Secondary | ICD-10-CM | POA: Diagnosis not present

## 2016-12-31 DIAGNOSIS — F329 Major depressive disorder, single episode, unspecified: Secondary | ICD-10-CM | POA: Diagnosis not present

## 2016-12-31 DIAGNOSIS — R911 Solitary pulmonary nodule: Secondary | ICD-10-CM | POA: Diagnosis not present

## 2016-12-31 DIAGNOSIS — Z1389 Encounter for screening for other disorder: Secondary | ICD-10-CM | POA: Diagnosis not present

## 2016-12-31 DIAGNOSIS — N401 Enlarged prostate with lower urinary tract symptoms: Secondary | ICD-10-CM | POA: Diagnosis not present

## 2017-01-26 ENCOUNTER — Telehealth: Payer: Self-pay | Admitting: Neurology

## 2017-01-26 NOTE — Telephone Encounter (Signed)
Spoke with patient in the office today. He is c/o persistent headaches still. He would like to be seen. Scheduled him for a work in visit 02/02/17 at 730am with CW,MD. I provided patient with an appt reminder card to bring with him.

## 2017-01-26 NOTE — Telephone Encounter (Signed)
Patient is in the lobby wanting to discuss medications with nurse. He said he can wait as long as he has to. He said he is only available by telephone 8 am to 10 am. Best call back [phone]

## 2017-02-02 ENCOUNTER — Encounter: Payer: Self-pay | Admitting: Neurology

## 2017-02-02 ENCOUNTER — Ambulatory Visit: Payer: Medicare HMO | Admitting: Neurology

## 2017-02-02 VITALS — BP 133/76 | HR 60 | Ht 65.0 in | Wt 143.0 lb

## 2017-02-02 DIAGNOSIS — G4489 Other headache syndrome: Secondary | ICD-10-CM

## 2017-02-02 MED ORDER — GABAPENTIN 100 MG PO CAPS: ORAL_CAPSULE | ORAL | 1 refills | Status: DC

## 2017-02-02 NOTE — Progress Notes (Signed)
Reason for visit: Headache  Jim Joseph is an 84 y.o. male  History of present illness:  Jim Joseph is an 84 year old right-handed black male with a history of headaches behind the left eye.  The patient has undergone a workup previously that revealed an unremarkable carotid Doppler study, MRI of the brain showed mild to moderate small vessel disease with evidence of chronic left frontal hemorrhage at some point.  The patient was placed on gabapentin in low-dose, he called back indicating that this resulted in diarrhea.  The patient however gained benefit with the headaches on the medication.  The patient returns to the office today for recurrence of the headache.  He is an extremely poor historian, he is not able to accurately relate any history regarding his headache.  Indicates that he does not believe the headache is daily in nature, he cannot give an estimate as to how frequent the headaches are and how long the headaches usually last.  The headaches are again around the left eye, he has no neck stiffness, he denies any visual changes with the headache or any nausea or vomiting.  He is complaining of difficulty sleeping at night because of urinary frequency, he has not talked to his primary doctor yet about this.  The patient indicates that he no longer has a prescription for gabapentin.  He returns to the office today for an evaluation.  Currently, he is complaining of constipation.  Indicates that he eats a lot of chocolate which may make his bowels more active.  Past Medical History:  Diagnosis Date  . GERD (gastroesophageal reflux disease)   . Glaucoma   . High cholesterol     History reviewed. No pertinent surgical history.  Family History  Problem Relation Age of Onset  . Heart disease Brother     Social history:  reports that  has never smoked. he has never used smokeless tobacco. He reports that he drinks alcohol. He reports that he does not use drugs.    Allergies    Allergen Reactions  . Gabapentin Diarrhea    Medications:  Prior to Admission medications   Medication Sig Start Date End Date Taking? Authorizing Provider  COD LIVER OIL PO Take 1 capsule by mouth daily.   Yes [provider]  gabapentin (NEURONTIN) 100 MG capsule One capsule in the morning and 2 in the evening. 03/29/16  Yes Kathrynn Ducking, MD  latanoprost (XALATAN) 0.005 % ophthalmic solution Place 1 drop into both eyes at bedtime. 02/06/16  Yes [provider]    ROS:  Out of a complete 14 system review of symptoms, the patient complains only of the following symptoms, and all other reviewed systems are negative.  Headache Urinary frequency  Blood pressure 133/76, pulse 60, height 5\' 5"  (1.651 m), weight 143 lb (64.9 kg).  Physical Exam  General: The patient is alert and cooperative at the time of the examination.  Skin: No significant peripheral edema is noted.   Neurologic Exam  Mental status: The patient is alert and oriented x 3 at the time of the examination. The patient has apparent normal recent and remote memory, with an apparently normal attention span and concentration ability.   Cranial nerves: Facial symmetry is present. Speech is normal, no aphasia or dysarthria is noted. Extraocular movements are full. Visual fields are full.  Motor: The patient has good strength in all 4 extremities.  Sensory examination: Soft touch sensation is symmetric on the face, arms, and  legs.  Coordination: The patient has good finger-nose-finger and heel-to-shin bilaterally.  Gait and station: The patient has a normal gait. Tandem gait is normal. Romberg is negative. No drift is seen.  Reflexes: Deep tendon reflexes are symmetric.    MRI brain 03/27/16:  IMPRESSION:  This MRI of the brain without contrast shows the following: 1.   Chronic left frontal hemorrhage.    2.   Chronic microvascular ischemic change. None of the foci appears to be acute. 3.    Mild chronic ethmoid sinusitis. 4.   There are no acute findings.  * MRI scan images were reviewed online. I agree with the written report.    Assessment/Plan:  1.  Left periorbital headache  The patient will be retried on gabapentin, he will call our office if the gabapentin results in diarrhea.  He will take 100 mg in the morning and 200 mg in the evening.  He will follow-up through this office in 4 months.  He will call for any dose adjustments of the medication.  Jill Alexanders MD 02/02/2017 7:33 AM  [facility] [address] [place], [place] [address]  Phone [phone] Fax [phone]

## 2017-02-02 NOTE — Patient Instructions (Signed)
  We will start gabapentin for the headache taking one in the morning and 2 in the evening of the 100 mg capsules. Call for any dose adjustments.  Neurontin (gabapentin) may result in drowsiness, ankle swelling, gait instability, or possibly dizziness. Please contact our office if significant side effects occur with this medication.

## 2017-03-04 ENCOUNTER — Telehealth: Payer: Self-pay | Admitting: Neurology

## 2017-03-04 DIAGNOSIS — R202 Paresthesia of skin: Secondary | ICD-10-CM

## 2017-03-04 NOTE — Telephone Encounter (Signed)
Pt is requesting a call on what his next move is going to be. Pt stated Dr. Jannifer Franklin referred him to someone who stated he had a pinched nerve in his hand but hasn't heard anything else. Also that now its getting harder for him to do things with that hand.

## 2017-03-05 NOTE — Telephone Encounter (Signed)
I called the patient.  The patient indicates that he has had some issues with what is felt to be carpal tunnel on the left hand, he is now having symptoms on the right.  We will get him set up for EMG and nerve conduction study to look at the nerve function in the arms.

## 2017-03-31 ENCOUNTER — Ambulatory Visit: Payer: Medicare HMO | Admitting: Neurology

## 2017-03-31 ENCOUNTER — Encounter: Payer: Self-pay | Admitting: Neurology

## 2017-03-31 ENCOUNTER — Ambulatory Visit (INDEPENDENT_AMBULATORY_CARE_PROVIDER_SITE_OTHER): Payer: Medicare HMO | Admitting: Neurology

## 2017-03-31 DIAGNOSIS — R202 Paresthesia of skin: Secondary | ICD-10-CM | POA: Diagnosis not present

## 2017-03-31 DIAGNOSIS — G5603 Carpal tunnel syndrome, bilateral upper limbs: Secondary | ICD-10-CM

## 2017-03-31 HISTORY — DX: Carpal tunnel syndrome, bilateral upper limbs: G56.03

## 2017-03-31 NOTE — Progress Notes (Addendum)
The patient comes in today for EMG nerve conduction study evaluation.  He has chronic issues with numbness of the left hand but now is developing similar problems with the right hand.  He has wrist splints, he has not been wearing them regularly.  The nerve conduction studies show evidence of bilateral carpal tunnel syndrome.  The patient is not wishing to consider surgery.  At age 84, this is probably reasonable.  The patient will need to start wearing his wrist splints regularly night and day for the next 2 months, and then convert to wearing them every night.   [place]    Nerve / Sites Muscle Latency Ref. Amplitude Ref. Rel Amp Segments Distance Velocity Ref. Area    ms ms mV mV %  cm m/s m/s mVms  R Median - APB     Wrist APB 5.5 ?4.4 4.2 ?4.0 100 Wrist - APB 7   12.2     Upper arm APB 10.6  3.6  85.6 Upper arm - Wrist 24 48 ?49 11.4  L Median - APB     Wrist APB 9.2 ?4.4 4.1 ?4.0 100 Wrist - APB 7   15.3     Upper arm APB 15.0  3.8  91.8 Upper arm - Wrist   ?49 14.7  R Ulnar - ADM     Wrist ADM 3.1 ?3.3 4.8 ?6.0 100 Wrist - ADM 7   10.9     B.Elbow ADM 7.3  4.0  84.4 B.Elbow - Wrist 22 52 ?49 11.0     A.Elbow ADM 9.5  3.3  82.1 A.Elbow - B.Elbow 12 55 ?49 9.3         A.Elbow - Wrist      L Ulnar - ADM     Wrist ADM 2.8 ?3.3 9.4 ?6.0 100 Wrist - ADM 7   25.0     B.Elbow ADM 6.8  8.3  88.4 B.Elbow - Wrist 22 54 ?49 24.4     A.Elbow ADM 9.0  8.2  99.5 A.Elbow - B.Elbow 12 56 ?49 24.8         A.Elbow - Wrist                 SNC    Nerve / Sites Rec. Site Peak Lat Ref.  Amp Ref. Segments Distance    ms ms V V  cm  R Median - Orthodromic (Dig II, Mid palm)     Dig II Wrist 5.3 ?3.4 1 ?10 Dig II - Wrist 13  L Median - Orthodromic (Dig II, Mid palm)     Dig II Wrist NR ?3.4 NR ?10 Dig II - Wrist 13  R Ulnar - Orthodromic, (Dig V, Mid palm)     Dig V Wrist 3.1 ?3.1 7 ?5 Dig V - Wrist 11  L Ulnar - Orthodromic, (Dig V, Mid palm)     Dig V Wrist 3.4 ?3.1 4 ?5 Dig V - Wrist 21     F  Wave    Nerve F Lat Ref.   ms ms  R Ulnar - ADM 31.1 ?32.0  L Ulnar - ADM 30.8 ?32.0         EMG full

## 2017-03-31 NOTE — Progress Notes (Signed)
Please refer to EMG and nerve conduction study procedure note. 

## 2017-03-31 NOTE — Procedures (Signed)
     HISTORY:  Jim Joseph is an 84 year old gentleman with a long-standing history of left hand numbness and discomfort.  More recently he has noted similar problem starting on the right hand as well.  He denies any neck pain or pain down the arms on either side.  He is being evaluated for a possible neuropathy or a cervical radiculopathy.  NERVE CONDUCTION STUDIES:  Nerve conduction studies were performed on both upper extremities.  The distal motor latencies for the median nerves were prolonged bilaterally, left greater than right.  The motor amplitudes for these nerves were normal bilaterally.  The distal motor latencies and motor amplitudes for the ulnar nerves were normal bilaterally with exception of a slightly low motor amplitude for the right ulnar nerve.  The nerve conduction velocities for the ulnar nerves were normal bilaterally, slightly slowed for the median nerves bilaterally.  The sensory latencies for the median nerves were unobtainable on the left, prolonged on the right, and normal for the right ulnar nerve.  The sensory latency for the left ulnar nerve was slightly prolonged.  The F-wave latencies for the ulnar nerves were normal bilaterally.  EMG STUDIES:  EMG study was performed on the left upper extremity:  The first dorsal interosseous muscle reveals 2 to 4 K units with full recruitment. No fibrillations or positive waves were noted. The abductor pollicis brevis muscle reveals 2 to 3 K units with decreased recruitment. No fibrillations or positive waves were noted. The extensor indicis proprius muscle reveals 1 to 3 K units with full recruitment. No fibrillations or positive waves were noted. The pronator teres muscle reveals 2 to 3 K units with full recruitment. No fibrillations or positive waves were noted. The biceps muscle reveals 1 to 2 K units with full recruitment. No fibrillations or positive waves were noted. The triceps muscle reveals 2 to 4 K units with full  recruitment. No fibrillations or positive waves were noted. The anterior deltoid muscle reveals 2 to 3 K units with full recruitment. No fibrillations or positive waves were noted. The cervical paraspinal muscles were tested at 2 levels. No abnormalities of insertional activity were seen at either level tested. There was good relaxation.    IMPRESSION:  Nerve conduction studies done on both upper extremities shows evidence of bilateral carpal tunnel syndrome of moderate severity on the left and mild severity on the right.  EMG evaluation of the left upper extremity was unremarkable with exception of some chronic stable denervation of the left APB muscle consistent with carpal tunnel syndrome.  No evidence of an overlying cervical radiculopathy was seen.  Jill Alexanders MD 03/31/2017 3:42 PM  [facility] [address] [place], [place] [address]  Phone [phone] Fax [phone]

## 2017-05-10 NOTE — Progress Notes (Signed)
Reason for visit: Headache  Jim Joseph is an 84 y.o. male  History of present illness:  Jim Joseph is an 84 year old right-handed black male with a history of headaches behind the left eye.  The patient has undergone a workup previously that revealed an unremarkable carotid Doppler study, MRI of the brain showed mild to moderate small vessel disease with evidence of chronic left frontal hemorrhage at some point.  The patient was placed on gabapentin in low-dose, he called back indicating that this resulted in diarrhea.  The patient however gained benefit with the headaches on the medication.   02/02/17 visit (Dr. Jannifer Franklin): The patient returns to the office today for recurrence of the headache.  He is an extremely poor historian, he is not able to accurately relate any history regarding his headache.  Indicates that he does not believe the headache is daily in nature, he cannot give an estimate as to how frequent the headaches are and how long the headaches usually last.  The headaches are again around the left eye, he has no neck stiffness, he denies any visual changes with the headache or any nausea or vomiting.  He is complaining of difficulty sleeping at night because of urinary frequency, he has not talked to his primary doctor yet about this.  The patient indicates that he no longer has a prescription for gabapentin.  He returns to the office today for an evaluation.  Currently, he is complaining of constipation.  Indicates that he eats a lot of chocolate which may make his bowels more active.  Update 05/11/17: Patient returns today for follow-up visit.  He states his headaches have improved some since starting gabapentin.  He is tolerating gabapentin well without side effects.  He states his headaches can be either morning, afternoon or evening and continues to have these once a day and located behind his left eye.  Denies neck pain, vision changes/loss, or dizziness.  These headaches are not  debilitating as patient is able to continue out his daily activities.  Patient underwent EMG/NCV on 03/31/2017 and did show evidence of bilateral carpal tunnel syndrome and it was recommended that patient wear wrist splints regularly day and night for 2 months and then convert to wearing them every night.  His carpal tunnel symptoms has been improving with the braces.  He states he does wear these for the most part throughout the day and night but will have to remove them at times due to pain or irritation.  Also has complaints of increased urination where he is up all day and night to void.  Denies difficulty starting or painful urination but is most concerned with frequency and leaking.  He believes this is due to his prostate and wants to ensure that he does not have prostate cancer.  Advised patient to speak to PCP regarding this concern.  Patient also concerned about being told that he has had a stroke in the past by this office but when he questions other doctors, they are unable to find it in the chart.  Advised patient that MRI on 03/28/2016 did show old left frontal ICH but no acute or subacute strokes.    Past Medical History:  Diagnosis Date  . Bilateral carpal tunnel syndrome 03/31/2017  . GERD (gastroesophageal reflux disease)   . Glaucoma   . High cholesterol     No past surgical history on file.  Family History  Problem Relation Age of Onset  . Heart disease Brother  Social history:  reports that he has never smoked. He has never used smokeless tobacco. He reports that he drinks alcohol. He reports that he does not use drugs.    Allergies  Allergen Reactions  . Gabapentin Diarrhea    Medications:  Prior to Admission medications   Medication Sig Start Date End Date Taking? Authorizing Provider  COD LIVER OIL PO Take 1 capsule by mouth daily.   Yes [provider]  gabapentin (NEURONTIN) 100 MG capsule One capsule in the morning and 2 in the evening. 03/29/16  Yes  Kathrynn Ducking, MD  latanoprost (XALATAN) 0.005 % ophthalmic solution Place 1 drop into both eyes at bedtime. 02/06/16  Yes [provider]    ROS:  Out of a complete 14 system review of symptoms, the patient complains only of the following symptoms, and all other reviewed systems are negative.  Hearing loss, ringing in ears, eye pain, incontinence of bladder, frequency of urination, joint pain, joint swelling, back pain, moles, headache and numbness   Blood pressure 125/62, pulse 78, height 5\' 5"  (1.651 m), weight 148 lb 3.2 oz (67.2 kg).  Physical Exam  General: The patient is alert and cooperative at the time of the examination.  Skin: No significant peripheral edema is noted.   Neurologic Exam  Mental status: The patient is alert and oriented x 3 at the time of the examination. The patient has apparent normal recent and remote memory, with an apparently normal attention span and concentration ability.  Cranial nerves: Facial symmetry is present. Speech is normal, no aphasia or dysarthria is noted. Extraocular movements are full. Visual fields are full.  Motor: The patient has good strength in all 4 extremities.  Sensory examination: Soft touch sensation is symmetric on the face, arms, and legs.  Coordination: The patient has good finger-nose-finger and heel-to-shin bilaterally.  Gait and station: The patient has a normal gait. Tandem gait is normal. Romberg is negative. No drift is seen.  Reflexes: Deep tendon reflexes are symmetric.    NCV with EMG 03/31/2017 IMPRESSION: Nerve conduction studies done on both upper extremities shows evidence of bilateral carpal tunnel syndrome of moderate severity on the left and mild severity on the right.  EMG evaluation of the left upper extremity was unremarkable with exception of some chronic stable denervation of the left APB muscle consistent with carpal tunnel syndrome.  No evidence of an overlying cervical radiculopathy  was seen.     Assessment/Plan:  1.  Left periorbital headache  As patient had some improvement with headaches and tolerating Neurontin without side effects, Neurontin will be increased to 300 mg twice daily  2.  Bilateral carpal tunnel syndrome  The patient will continue to wear bilateral wrist splints day and night for 1 more month and then transition to night time wearing only   Patient will follow-up with either myself or Megan, NP in 6 months or call earlier if needed    Greater than 50% time during this 25 minute consultation visit was spent on counseling and coordination of care about headache and carpal tunnel syndrome, (risk factors), and answering questions.    Venancio Poisson, AGNP-BC  [facility] [address] [place], [place] [address]  Phone [phone] Fax [phone]

## 2017-05-11 ENCOUNTER — Encounter: Payer: Self-pay | Admitting: Adult Health

## 2017-05-11 ENCOUNTER — Ambulatory Visit: Payer: Medicare HMO | Admitting: Adult Health

## 2017-05-11 VITALS — BP 125/62 | HR 78 | Ht 65.0 in | Wt 148.2 lb

## 2017-05-11 DIAGNOSIS — G5603 Carpal tunnel syndrome, bilateral upper limbs: Secondary | ICD-10-CM | POA: Diagnosis not present

## 2017-05-11 DIAGNOSIS — G4489 Other headache syndrome: Secondary | ICD-10-CM

## 2017-05-11 MED ORDER — GABAPENTIN 300 MG PO CAPS: 300.0000 mg | ORAL_CAPSULE | Freq: Two times a day (BID) | ORAL | 3 refills | Status: DC

## 2017-05-11 NOTE — Progress Notes (Signed)
I have read the note, and I agree with the clinical assessment and plan.  Cerys Winget K Joni Norrod   

## 2017-05-11 NOTE — Patient Instructions (Signed)
Your Plan:  Increase gabapentin 300mg  BID   Wear braces during the day and night for the next month and after that, you can wear braces at night only  Continue to eat healthy and exercise  Speak with your primary care doctor regarding prostate concerns   Follow up in 6 months with Jinny Blossom, NP    Thank you for coming to see [place] at [facility]. I hope we have been able to provide you high quality care today.  You may receive a patient satisfaction survey over the next few weeks. We would appreciate your feedback and comments so that we may continue to improve ourselves and the health of our patients.

## 2017-06-03 ENCOUNTER — Ambulatory Visit: Payer: Medicare HMO | Admitting: Adult Health

## 2017-11-08 ENCOUNTER — Other Ambulatory Visit: Payer: Self-pay

## 2017-11-08 ENCOUNTER — Emergency Department
Admission: EM | Admit: 2017-11-08 | Discharge: 2017-11-08 | Disposition: A | Discharge status: Home / Self Care | Payer: Medicare HMO | Attending: Emergency Medicine | Admitting: Emergency Medicine

## 2017-11-08 DIAGNOSIS — T7840XA Allergy, unspecified, initial encounter: Secondary | ICD-10-CM | POA: Diagnosis not present

## 2017-11-08 DIAGNOSIS — Z79899 Other long term (current) drug therapy: Secondary | ICD-10-CM | POA: Insufficient documentation

## 2017-11-08 DIAGNOSIS — R42 Dizziness and giddiness: Secondary | ICD-10-CM | POA: Diagnosis not present

## 2017-11-08 DIAGNOSIS — T63441A Toxic effect of venom of bees, accidental (unintentional), initial encounter: Secondary | ICD-10-CM | POA: Diagnosis not present

## 2017-11-08 DIAGNOSIS — T7849XA Other allergy, initial encounter: Secondary | ICD-10-CM | POA: Diagnosis not present

## 2017-11-08 LAB — BASIC METABOLIC PANEL
Anion gap: 9 (ref 5–15)
BUN: 21 mg/dL (ref 8–23)
CHLORIDE: 104 mmol/L (ref 98–111)
CO2: 27 mmol/L (ref 22–32)
Calcium: 9.3 mg/dL (ref 8.9–10.3)
Creatinine, Ser: 1.23 mg/dL (ref 0.61–1.24)
GFR calc non Af Amer: 51 mL/min — ABNORMAL LOW (ref >60)
GFR, EST AFRICAN AMERICAN: 59 mL/min — AB (ref >60)
GLUCOSE: 163 mg/dL — AB (ref 70–99)
Potassium: 3.8 mmol/L (ref 3.5–5.1)
Sodium: 140 mmol/L (ref 135–145)

## 2017-11-08 LAB — CBC
HEMATOCRIT: 41.1 % (ref 39.0–52.0)
HEMOGLOBIN: 13 g/dL (ref 13.0–17.0)
MCH: 31.9 pg (ref 26.0–34.0)
MCHC: 31.6 g/dL (ref 30.0–36.0)
MCV: 101 fL — AB (ref 80.0–100.0)
Platelets: 145 10*3/uL — ABNORMAL LOW (ref 150–400)
RBC: 4.07 MIL/uL — ABNORMAL LOW (ref 4.22–5.81)
RDW: 11.5 % (ref 11.5–15.5)
WBC: 5.6 10*3/uL (ref 4.0–10.5)
nRBC: 0 % (ref 0.0–0.2)

## 2017-11-08 LAB — TROPONIN I

## 2017-11-08 MED ORDER — FAMOTIDINE IN NACL 20-0.9 MG/50ML-% IV SOLN
20.0000 mg | Freq: Once | INTRAVENOUS | Status: AC
  Administered 2017-11-08: 20 mg via INTRAVENOUS
  Filled 2017-11-08: qty 50

## 2017-11-08 MED ORDER — EPINEPHRINE 0.3 MG/0.3ML IJ SOAJ: 0.3000 mg | Freq: Once | INTRAMUSCULAR | 1 refills | Status: AC

## 2017-11-08 MED ORDER — DIPHENHYDRAMINE HCL 50 MG/ML IJ SOLN
25.0000 mg | Freq: Once | INTRAMUSCULAR | Status: AC
  Administered 2017-11-08: 25 mg via INTRAVENOUS
  Filled 2017-11-08: qty 1

## 2017-11-08 MED ORDER — SODIUM CHLORIDE 0.9 % IV BOLUS
1000.0000 mL | Freq: Once | INTRAVENOUS | Status: AC
  Administered 2017-11-08: 1000 mL via INTRAVENOUS

## 2017-11-08 MED ORDER — METHYLPREDNISOLONE SODIUM SUCC 125 MG IJ SOLR
125.0000 mg | Freq: Once | INTRAMUSCULAR | Status: AC
  Administered 2017-11-08: 125 mg via INTRAVENOUS
  Filled 2017-11-08: qty 2

## 2017-11-08 MED ORDER — PREDNISONE 20 MG PO TABS: 40.0000 mg | ORAL_TABLET | Freq: Every day | ORAL | 0 refills | Status: AC

## 2017-11-08 NOTE — ED Triage Notes (Signed)
Pt comes into the ED via EMS from home, states he was getting into his truck when he felt a sting to right upper arm and states he became hot feeling and itching all over., pt hypotensive with EMS 90/53, pt denies any difficulty breathing or swallowing. Airway is patent. Pt is a/ox4 on arrival

## 2017-11-08 NOTE — ED Provider Notes (Signed)
[facility] Emergency Department Provider Note  ____________________________________________  Time seen: Approximately 4:28 PM  I have reviewed the triage vital signs and the nursing notes.   HISTORY  Chief Complaint Allergic Reaction   HPI DEERIC CRUISE is a 84 y.o. male with h/o GERD, glaucoma, and HLD who presents for evaluation of allergic reaction. Patient reports that he was getting into the car when he felt a sting on his R arm. He immediately felt hot and started to itch all over, he then became dizzy and felt like he was going to pass but never fully loss consciousness.  Patient never saw a bee but reports that he felt like it.  He reports being stung twice in the back but never had anaphylaxis from it.  When EMS arrived the patient was hypotensive with systolics in the 83M and bradycardic with heart rate in the 50s. No CP, no SOB, no HA, no facial droop or slurred speech. Vitals improved with IVF en route. Patient reports feeling back to baseline at this time.    Past Medical History:  Diagnosis Date  . Bilateral carpal tunnel syndrome 03/31/2017  . GERD (gastroesophageal reflux disease)   . Glaucoma   . High cholesterol     Patient Active Problem List   Diagnosis Date Noted  . Bilateral carpal tunnel syndrome 03/31/2017  . Headache syndrome 03/10/2016  . Tonsil, abscess 10/11/2011    Class: Acute    History reviewed. No pertinent surgical history.  Prior to Admission medications   Medication Sig Start Date End Date Taking? Authorizing Provider  EPINEPHrine 0.3 mg/0.3 mL IJ SOAJ injection Inject 0.3 mLs (0.3 mg total) into the muscle once for 1 dose. 11/08/17 11/08/17  Rudene Re, MD  gabapentin (NEURONTIN) 300 MG capsule Take 1 capsule (300 mg total) by mouth 2 (two) times daily. 05/11/17   Venancio Poisson, NP  latanoprost (XALATAN) 0.005 % ophthalmic solution Place 1 drop into both eyes at bedtime. 02/06/16   [provider]  predniSONE (DELTASONE) 20 MG tablet Take 2 tablets (40 mg total) by mouth daily for 4 days. 11/08/17 11/12/17  Rudene Re, MD    Allergies Gabapentin  Family History  Problem Relation Age of Onset  . Heart disease Brother     Social History Social History   Tobacco Use  . Smoking status: Never Smoker  . Smokeless tobacco: Never Used  Substance Use Topics  . Alcohol use: Yes    Comment: ocass  . Drug use: No    Review of Systems  Constitutional: Negative for fever. + dizziness Eyes: Negative for visual changes. ENT: Negative for sore throat. Neck: No neck pain  Cardiovascular: Negative for chest pain. Respiratory: Negative for shortness of breath. Gastrointestinal: Negative for abdominal pain, vomiting or diarrhea. Genitourinary: Negative for dysuria. Musculoskeletal: Negative for back pain. Skin: Negative for rash. + diffuse itching, flushing of skin Neurological: Negative for headaches, weakness or numbness. Psych: No SI or HI  ____________________________________________   PHYSICAL EXAM:  VITAL SIGNS: Vitals:   11/08/17 1900 11/08/17 1930  BP: (!) 115/58 (!) 118/56  Pulse: 68 61  Resp: 16 18  SpO2: 97% 96%   Constitutional: Alert and oriented. Well appearing and in no apparent distress. HEENT:      Head: Normocephalic and atraumatic.         Eyes: Conjunctivae are normal. Sclera is non-icteric.       Mouth/Throat: Mucous membranes are moist.       Neck:  Supple with no signs of meningismus. Cardiovascular: Regular rate and rhythm. No murmurs, gallops, or rubs. 2+ symmetrical distal pulses are present in all extremities. No JVD. Respiratory: Normal respiratory effort. Lungs are clear to auscultation bilaterally. No wheezes, crackles, or rhonchi.  Gastrointestinal: Soft, non tender, and non distended with positive bowel sounds. No rebound or guarding. Musculoskeletal: Nontender with normal range of motion in all extremities. No edema,  cyanosis, or erythema of extremities. Neurologic: Normal speech and language. Face is symmetric. Moving all extremities. No gross focal neurologic deficits are appreciated. Skin: Skin is warm, dry and intact. No rash noted. Psychiatric: Mood and affect are normal. Speech and behavior are normal.  ____________________________________________   LABS (all labs ordered are listed, but only abnormal results are displayed)  Labs Reviewed  CBC - Abnormal; Notable for the following components:      Result Value   RBC 4.07 (*)    MCV 101.0 (*)    Platelets 145 (*)    All other components within normal limits  BASIC METABOLIC PANEL - Abnormal; Notable for the following components:   Glucose, Bld 163 (*)    GFR calc non Af Amer 51 (*)    GFR calc Af Amer 59 (*)    All other components within normal limits  TROPONIN I   ____________________________________________  EKG  ED ECG REPORT I, Rudene Re, the attending physician, personally viewed and interpreted this ECG.  Sinus bradycardia, rate of 46, normal intervals, normal axis, early R wave progression with no ST elevations or depressions.  No prior for comparison peer ____________________________________________  RADIOLOGY  none ____________________________________________   PROCEDURES  Procedure(s) performed: None Procedures Critical Care performed:  None ____________________________________________   INITIAL IMPRESSION / ASSESSMENT AND PLAN / ED COURSE   84 y.o. male with h/o GERD, glaucoma, and HLD who presents for evaluation of allergic reaction from possible insect/bee sting causing near syncopal event in the setting of bradycardia and hypotension. There are no signs of anaphylaxis on arrival. Vitals WNL, no hives, no stridor, no angioedema, no wheezing.  Patient points to the lateral right proximal arm as the location where he felt stunned but there is no obvious erythema or sting seen, therefore will get EKG and  basic labs, will monitor on telemetry for syncope eval. Will give IVF, IV solumedrol and IV pepcid. No indication for epi.    _________________________ 7:52 PM on 11/08/2017 -----------------------------------------  Patient later developed a rash and was treated with Benadryl with resolution of it.  Labs with no acute findings.  Patient remained stable with no signs of anaphylaxis.  Will discharge home with a prescription for EpiPen and steroids.  Discussed indications for epi use.   As part of my medical decision making, I reviewed the following data within the [facility] notes reviewed and incorporated, Labs reviewed , EKG interpreted , Old chart reviewed, Notes from prior ED visits and [place] Controlled Substance Database    Pertinent labs & imaging results that were available during my care of the patient were reviewed by me and considered in my medical decision making (see chart for details).    ____________________________________________   FINAL CLINICAL IMPRESSION(S) / ED DIAGNOSES  Final diagnoses:  Allergic reaction, initial encounter      NEW MEDICATIONS STARTED DURING THIS VISIT:  ED Discharge Orders         Ordered    EPINEPHrine 0.3 mg/0.3 mL IJ SOAJ injection   Once     11/08/17 1949  predniSONE (DELTASONE) 20 MG tablet  Daily     11/08/17 1949           Note:  This document was prepared using Dragon voice recognition software and may include unintentional dictation errors.    Rudene Re, MD 11/08/17 [phone]

## 2017-11-08 NOTE — ED Notes (Signed)
Pt states he feels better. Itching has resolved.

## 2017-11-09 NOTE — Progress Notes (Deleted)
Reason for visit: Headache  Jim Joseph is an 84 y.o. male  History of present illness:  Jim Joseph is an 84 year old right-handed black male with a history of headaches behind the left eye.  The patient has undergone a workup previously that revealed an unremarkable carotid Doppler study, MRI of the brain showed mild to moderate small vessel disease with evidence of chronic left frontal hemorrhage at some point.  The patient was placed on gabapentin in low-dose, he called back indicating that this resulted in diarrhea.  The patient however gained benefit with the headaches on the medication.   02/02/17 visit (Dr. Jannifer Franklin): The patient returns to the office today for recurrence of the headache.  He is an extremely poor historian, he is not able to accurately relate any history regarding his headache.  Indicates that he does not believe the headache is daily in nature, he cannot give an estimate as to how frequent the headaches are and how long the headaches usually last.  The headaches are again around the left eye, he has no neck stiffness, he denies any visual changes with the headache or any nausea or vomiting.  He is complaining of difficulty sleeping at night because of urinary frequency, he has not talked to his primary doctor yet about this.  The patient indicates that he no longer has a prescription for gabapentin.  He returns to the office today for an evaluation.  Currently, he is complaining of constipation.  Indicates that he eats a lot of chocolate which may make his bowels more active.  05/11/17 visit: Patient returns today for follow-up visit.  He states his headaches have improved some since starting gabapentin.  He is tolerating gabapentin well without side effects.  He states his headaches can be either morning, afternoon or evening and continues to have these once a day and located behind his left eye.  Denies neck pain, vision changes/loss, or dizziness.  These headaches are not  debilitating as patient is able to continue out his daily activities.  Patient underwent EMG/NCV on 03/31/2017 and did show evidence of bilateral carpal tunnel syndrome and it was recommended that patient wear wrist splints regularly day and night for 2 months and then convert to wearing them every night.  His carpal tunnel symptoms has been improving with the braces.  He states he does wear these for the most part throughout the day and night but will have to remove them at times due to pain or irritation.  Also has complaints of increased urination where he is up all day and night to void.  Denies difficulty starting or painful urination but is most concerned with frequency and leaking.  He believes this is due to his prostate and wants to ensure that he does not have prostate cancer.  Advised patient to speak to PCP regarding this concern.  Patient also concerned about being told that he has had a stroke in the past by this office but when he questions other doctors, they are unable to find it in the chart.  Advised patient that MRI on 03/28/2016 did show old left frontal ICH but no acute or subacute strokes.  Interval history 11/10/17:      Past Medical History:  Diagnosis Date  . Bilateral carpal tunnel syndrome 03/31/2017  . GERD (gastroesophageal reflux disease)   . Glaucoma   . High cholesterol     No past surgical history on file.  Family History  Problem Relation Age of Onset  .  Heart disease Brother     Social history:  reports that he has never smoked. He has never used smokeless tobacco. He reports that he drinks alcohol. He reports that he does not use drugs.    Allergies  Allergen Reactions  . Gabapentin Diarrhea    Medications:  Prior to Admission medications   Medication Sig Start Date End Date Taking? Authorizing Provider  COD LIVER OIL PO Take 1 capsule by mouth daily.   Yes [provider]  gabapentin (NEURONTIN) 100 MG capsule One capsule in the morning  and 2 in the evening. 03/29/16  Yes Kathrynn Ducking, MD  latanoprost (XALATAN) 0.005 % ophthalmic solution Place 1 drop into both eyes at bedtime. 02/06/16  Yes [provider]    ROS:  Out of a complete 14 system review of symptoms, the patient complains only of the following symptoms, and all other reviewed systems are negative.  Hearing loss, ringing in ears, eye pain, incontinence of bladder, frequency of urination, joint pain, joint swelling, back pain, moles, headache and numbness   There were no vitals taken for this visit.  Physical Exam  General: The patient is alert and cooperative at the time of the examination.  Skin: No significant peripheral edema is noted.   Neurologic Exam  Mental status: The patient is alert and oriented x 3 at the time of the examination. The patient has apparent normal recent and remote memory, with an apparently normal attention span and concentration ability.  Cranial nerves: Facial symmetry is present. Speech is normal, no aphasia or dysarthria is noted. Extraocular movements are full. Visual fields are full.  Motor: The patient has good strength in all 4 extremities.  Sensory examination: Soft touch sensation is symmetric on the face, arms, and legs.  Coordination: The patient has good finger-nose-finger and heel-to-shin bilaterally.  Gait and station: The patient has a normal gait. Tandem gait is normal. Romberg is negative. No drift is seen.  Reflexes: Deep tendon reflexes are symmetric.    NCV with EMG 03/31/2017 IMPRESSION: Nerve conduction studies done on both upper extremities shows evidence of bilateral carpal tunnel syndrome of moderate severity on the left and mild severity on the right.  EMG evaluation of the left upper extremity was unremarkable with exception of some chronic stable denervation of the left APB muscle consistent with carpal tunnel syndrome.  No evidence of an overlying cervical radiculopathy was  seen.     Assessment/Plan:  1.  Left periorbital headache  As patient had some improvement with headaches and tolerating Neurontin without side effects, Neurontin will be increased to 300 mg twice daily  2.  Bilateral carpal tunnel syndrome  The patient will continue to wear bilateral wrist splints day and night for 1 more month and then transition to night time wearing only   Patient will follow-up with either myself or Megan, NP in 6 months or call earlier if needed    Greater than 50% time during this 25 minute consultation visit was spent on counseling and coordination of care about headache and carpal tunnel syndrome, (risk factors), and answering questions.    Venancio Poisson, AGNP-BC  [facility] [address] [place], [place] [address]  Phone [phone] Fax [phone]

## 2017-11-10 ENCOUNTER — Ambulatory Visit: Payer: Medicare HMO | Admitting: Adult Health

## 2017-11-10 DIAGNOSIS — H401111 Primary open-angle glaucoma, right eye, mild stage: Secondary | ICD-10-CM | POA: Diagnosis not present

## 2017-11-10 DIAGNOSIS — H04123 Dry eye syndrome of bilateral lacrimal glands: Secondary | ICD-10-CM | POA: Diagnosis not present

## 2017-11-10 DIAGNOSIS — T63441D Toxic effect of venom of bees, accidental (unintentional), subsequent encounter: Secondary | ICD-10-CM | POA: Diagnosis not present

## 2017-11-10 DIAGNOSIS — Z961 Presence of intraocular lens: Secondary | ICD-10-CM | POA: Diagnosis not present

## 2017-11-11 ENCOUNTER — Encounter: Payer: Self-pay | Admitting: Adult Health

## 2017-11-11 DIAGNOSIS — Z0100 Encounter for examination of eyes and vision without abnormal findings: Secondary | ICD-10-CM | POA: Diagnosis not present

## 2017-11-17 ENCOUNTER — Ambulatory Visit: Payer: Medicare HMO | Admitting: Adult Health

## 2017-11-23 ENCOUNTER — Ambulatory Visit: Payer: Medicare HMO | Admitting: Adult Health

## 2017-11-23 ENCOUNTER — Telehealth: Payer: Self-pay

## 2017-11-23 NOTE — Telephone Encounter (Signed)
Patient no show for appt today. 

## 2017-11-23 NOTE — Progress Notes (Deleted)
Reason for visit: Headache  Jim Joseph is an 84 y.o. male  History of present illness:  Jim Joseph is an 84 year old right-handed black male with a history of headaches behind the left eye.  The patient has undergone a workup previously that revealed an unremarkable carotid Doppler study, MRI of the brain showed mild to moderate small vessel disease with evidence of chronic left frontal hemorrhage at some point.  The patient was placed on gabapentin in low-dose, he called back indicating that this resulted in diarrhea.  The patient however gained benefit with the headaches on the medication.   02/02/17 visit (Dr. Jannifer Franklin): The patient returns to the office today for recurrence of the headache.  He is an extremely poor historian, he is not able to accurately relate any history regarding his headache.  Indicates that he does not believe the headache is daily in nature, he cannot give an estimate as to how frequent the headaches are and how long the headaches usually last.  The headaches are again around the left eye, he has no neck stiffness, he denies any visual changes with the headache or any nausea or vomiting.  He is complaining of difficulty sleeping at night because of urinary frequency, he has not talked to his primary doctor yet about this.  The patient indicates that he no longer has a prescription for gabapentin.  He returns to the office today for an evaluation.  Currently, he is complaining of constipation.  Indicates that he eats a lot of chocolate which may make his bowels more active.  05/11/17 visit: Patient returns today for follow-up visit.  He states his headaches have improved some since starting gabapentin.  He is tolerating gabapentin well without side effects.  He states his headaches can be either morning, afternoon or evening and continues to have these once a day and located behind his left eye.  Denies neck pain, vision changes/loss, or dizziness.  These headaches are not  debilitating as patient is able to continue out his daily activities.  Patient underwent EMG/NCV on 03/31/2017 and did show evidence of bilateral carpal tunnel syndrome and it was recommended that patient wear wrist splints regularly day and night for 2 months and then convert to wearing them every night.  His carpal tunnel symptoms has been improving with the braces.  He states he does wear these for the most part throughout the day and night but will have to remove them at times due to pain or irritation.  Also has complaints of increased urination where he is up all day and night to void.  Denies difficulty starting or painful urination but is most concerned with frequency and leaking.  He believes this is due to his prostate and wants to ensure that he does not have prostate cancer.  Advised patient to speak to PCP regarding this concern.  Patient also concerned about being told that he has had a stroke in the past by this office but when he questions other doctors, they are unable to find it in the chart.  Advised patient that MRI on 03/28/2016 did show old left frontal ICH but no acute or subacute strokes.  Interval history 11/23/2017:     Past Medical History:  Diagnosis Date  . Bilateral carpal tunnel syndrome 03/31/2017  . GERD (gastroesophageal reflux disease)   . Glaucoma   . High cholesterol     No past surgical history on file.  Family History  Problem Relation Age of Onset  .  Heart disease Brother     Social history:  reports that he has never smoked. He has never used smokeless tobacco. He reports that he drinks alcohol. He reports that he does not use drugs.    Allergies  Allergen Reactions  . Gabapentin Diarrhea    Medications:  Prior to Admission medications   Medication Sig Start Date End Date Taking? Authorizing Provider  COD LIVER OIL PO Take 1 capsule by mouth daily.   Yes [provider]  gabapentin (NEURONTIN) 100 MG capsule One capsule in the morning  and 2 in the evening. 03/29/16  Yes Kathrynn Ducking, MD  latanoprost (XALATAN) 0.005 % ophthalmic solution Place 1 drop into both eyes at bedtime. 02/06/16  Yes [provider]    ROS:  Out of a complete 14 system review of symptoms, the patient complains only of the following symptoms, and all other reviewed systems are negative.  Hearing loss, ringing in ears, eye pain, incontinence of bladder, frequency of urination, joint pain, joint swelling, back pain, moles, headache and numbness   There were no vitals taken for this visit.  Physical Exam  General: The patient is alert and cooperative at the time of the examination.  Skin: No significant peripheral edema is noted.   Neurologic Exam  Mental status: The patient is alert and oriented x 3 at the time of the examination. The patient has apparent normal recent and remote memory, with an apparently normal attention span and concentration ability.  Cranial nerves: Facial symmetry is present. Speech is normal, no aphasia or dysarthria is noted. Extraocular movements are full. Visual fields are full.  Motor: The patient has good strength in all 4 extremities.  Sensory examination: Soft touch sensation is symmetric on the face, arms, and legs.  Coordination: The patient has good finger-nose-finger and heel-to-shin bilaterally.  Gait and station: The patient has a normal gait. Tandem gait is normal. Romberg is negative. No drift is seen.  Reflexes: Deep tendon reflexes are symmetric.    NCV with EMG 03/31/2017 IMPRESSION: Nerve conduction studies done on both upper extremities shows evidence of bilateral carpal tunnel syndrome of moderate severity on the left and mild severity on the right.  EMG evaluation of the left upper extremity was unremarkable with exception of some chronic stable denervation of the left APB muscle consistent with carpal tunnel syndrome.  No evidence of an overlying cervical radiculopathy was  seen.     Assessment/Plan:  1.  Left periorbital headache  As patient had some improvement with headaches and tolerating Neurontin without side effects, Neurontin will be increased to 300 mg twice daily  2.  Bilateral carpal tunnel syndrome  The patient will continue to wear bilateral wrist splints day and night for 1 more month and then transition to night time wearing only   Patient will follow-up with either myself or Megan, NP in 6 months or call earlier if needed    Greater than 50% time during this 25 minute consultation visit was spent on counseling and coordination of care about headache and carpal tunnel syndrome, (risk factors), and answering questions.    Venancio Poisson, AGNP-BC  [facility] [address] [place], [place] [address]  Phone [phone] Fax [phone]

## 2017-11-24 ENCOUNTER — Encounter: Payer: Self-pay | Admitting: Adult Health

## 2017-12-08 ENCOUNTER — Ambulatory Visit: Payer: Medicare HMO | Admitting: Adult Health

## 2017-12-08 ENCOUNTER — Encounter: Payer: Self-pay | Admitting: Adult Health

## 2017-12-08 VITALS — BP 113/63 | HR 58 | Ht 65.0 in | Wt 143.2 lb

## 2017-12-08 DIAGNOSIS — G5603 Carpal tunnel syndrome, bilateral upper limbs: Secondary | ICD-10-CM

## 2017-12-08 DIAGNOSIS — G4489 Other headache syndrome: Secondary | ICD-10-CM

## 2017-12-08 MED ORDER — GABAPENTIN 300 MG PO CAPS: 300.0000 mg | ORAL_CAPSULE | Freq: Two times a day (BID) | ORAL | 3 refills | Status: DC

## 2017-12-08 NOTE — Progress Notes (Signed)
I have read the note, and I agree with the clinical assessment and plan.  Charles K Willis   

## 2017-12-08 NOTE — Progress Notes (Signed)
Reason for visit: Headache  Jim Joseph is an 84 y.o. male  History of present illness:  Jim Joseph is an 84 year old right-handed black male with a history of headaches behind the left eye.  The patient has undergone a workup previously that revealed an unremarkable carotid Doppler study, MRI of the brain showed mild to moderate small vessel disease with evidence of chronic left frontal hemorrhage at some point.  The patient was placed on gabapentin in low-dose, he called back indicating that this resulted in diarrhea.  The patient however gained benefit with the headaches on the medication.   02/02/17 visit (Dr. Jannifer Franklin): The patient returns to the office today for recurrence of the headache.  He is an extremely poor historian, he is not able to accurately relate any history regarding his headache.  Indicates that he does not believe the headache is daily in nature, he cannot give an estimate as to how frequent the headaches are and how long the headaches usually last.  The headaches are again around the left eye, he has no neck stiffness, he denies any visual changes with the headache or any nausea or vomiting.  He is complaining of difficulty sleeping at night because of urinary frequency, he has not talked to his primary doctor yet about this.  The patient indicates that he no longer has a prescription for gabapentin.  He returns to the office today for an evaluation.  Currently, he is complaining of constipation.  Indicates that he eats a lot of chocolate which may make his bowels more active.  05/11/17 visit: Patient returns today for follow-up visit.  He states his headaches have improved some since starting gabapentin.  He is tolerating gabapentin well without side effects.  He states his headaches can be either morning, afternoon or evening and continues to have these once a day and located behind his left eye.  Denies neck pain, vision changes/loss, or dizziness.  These headaches are not  debilitating as patient is able to continue out his daily activities.  Patient underwent EMG/NCV on 03/31/2017 and did show evidence of bilateral carpal tunnel syndrome and it was recommended that patient wear wrist splints regularly day and night for 2 months and then convert to wearing them every night.  His carpal tunnel symptoms has been improving with the braces.  He states he does wear these for the most part throughout the day and night but will have to remove them at times due to pain or irritation.  Also has complaints of increased urination where he is up all day and night to void.  Denies difficulty starting or painful urination but is most concerned with frequency and leaking.  He believes this is due to his prostate and wants to ensure that he does not have prostate cancer.  Advised patient to speak to PCP regarding this concern.  Patient also concerned about being told that he has had a stroke in the past by this office but when he questions other doctors, they are unable to find it in the chart.  Advised patient that MRI on 03/28/2016 did show old left frontal ICH but no acute or subacute strokes.  Interval history 12/08/2017: Patient is being seen today for six-month follow-up visit. After prior visit, gabapentin increased and he believes headaches have improved. He is unable to state when the last time he had a headache. He continues to wear wrist brace for carpel tunnel. Blood pressure today satisfactory at 113/63. He again has concerns  regarding being told in the past that he has had a stroke in the past but states he was never told that during that time. No further concerns at this time.      Past Medical History:  Diagnosis Date  . Bilateral carpal tunnel syndrome 03/31/2017  . GERD (gastroesophageal reflux disease)   . Glaucoma   . High cholesterol     History reviewed. No pertinent surgical history.  Family History  Problem Relation Age of Onset  . Heart disease Brother      Social history:  reports that he has never smoked. He has never used smokeless tobacco. He reports that he drinks alcohol. He reports that he does not use drugs.    Allergies  Allergen Reactions  . Gabapentin Diarrhea    Medications:  Prior to Admission medications   Medication Sig Start Date End Date Taking? Authorizing Provider  COD LIVER OIL PO Take 1 capsule by mouth daily.   Yes [provider]  gabapentin (NEURONTIN) 100 MG capsule One capsule in the morning and 2 in the evening. 03/29/16  Yes Kathrynn Ducking, MD  latanoprost (XALATAN) 0.005 % ophthalmic solution Place 1 drop into both eyes at bedtime. 02/06/16  Yes [provider]    ROS:  Out of a complete 14 system review of symptoms, the patient complains only of the following symptoms, and all other reviewed systems are negative.  Ringing in ears, eye redness, incontinence of bladder, frequency of urination, joint pain, joint swelling, moles, and numbness   Blood pressure 113/63, pulse (!) 58, height 5\' 5"  (1.651 m), weight 143 lb 3.2 oz (65 kg).  Physical Exam  General: The patient is alert and cooperative at the time of the examination.  Skin: No significant peripheral edema is noted.   Neurologic Exam  Mental status: The patient is alert and oriented x 3 at the time of the examination. The patient has apparent normal recent and remote memory, with an apparently normal attention span and concentration ability.  Cranial nerves: Facial symmetry is present. Speech is normal, no aphasia or dysarthria is noted. Extraocular movements are full. Visual fields are full.  Motor: The patient has good strength in all 4 extremities.  Sensory examination: Soft touch sensation is symmetric on the face, arms, and legs.  Coordination: The patient has good finger-nose-finger and heel-to-shin bilaterally.  Gait and station: The patient has a normal gait. Tandem gait is normal. Romberg is negative. No  drift is seen.  Reflexes: Deep tendon reflexes are symmetric.    NCV with EMG 03/31/2017 IMPRESSION: Nerve conduction studies done on both upper extremities shows evidence of bilateral carpal tunnel syndrome of moderate severity on the left and mild severity on the right.  EMG evaluation of the left upper extremity was unremarkable with exception of some chronic stable denervation of the left APB muscle consistent with carpal tunnel syndrome.  No evidence of an overlying cervical radiculopathy was seen.     Assessment/Plan:  1.  Left periorbital headache  As patient has been stable on gabapentin 300 mg twice daily, we will continue current dose -refill provided  2.  Bilateral carpal tunnel syndrome  Patient currently wearing a carpal tunnel brace but typically will wear this at night only.  Advised him to limit daytime use unless painful or doing activity with repetitive wrist motions.   Patient will follow-up in 1 years time or call earlier with questions, concerns or need a sooner follow-up appointment    Greater  than 50% time during this 25 minute consultation visit was spent on counseling and coordination of care about headache and carpal tunnel syndrome, (risk factors), and answering questions.    Venancio Poisson, AGNP-BC  [facility] [address] [place], [place] [address]  Phone [phone] Fax [phone]

## 2017-12-08 NOTE — Patient Instructions (Signed)
Your Plan:  Continue gabapentin 300mg  twice a day for your headaches   You will follow up in 1 year or call earlier if needed     Thank you for coming to see [place] at [facility]. I hope we have been able to provide you high quality care today.  You may receive a patient satisfaction survey over the next few weeks. We would appreciate your feedback and comments so that we may continue to improve ourselves and the health of our patients.

## 2017-12-13 ENCOUNTER — Telehealth: Payer: Self-pay | Admitting: Adult Health

## 2017-12-13 NOTE — Telephone Encounter (Signed)
I called patient back. He was requesting to be seen for  shoulder pain located in left arm. This is a chronic issue. Has never been seen here for this sx. Pain has gotten worse. Unable to lift his arm up.  Thinks Dr. Delfina Redwood sent him to a MD for further evaluation. Cannot remember. I advised he should contact Dr. Delfina Redwood to be evaluated at their office for this. He also c/o of ringing in the ears. Has hearing aids but cannot tolerate them. Advised him to f/u with PCP on this as well and he may need to f/u with whoever set him up with hearing aids to make some adjustments.

## 2017-12-13 NOTE — Telephone Encounter (Signed)
Patient called and requested to speak with the nurse or Janett Billow. He states that he wants to speak with someone who has knowledge of his medical history. Please call and advise.

## 2018-02-03 DIAGNOSIS — R5383 Other fatigue: Secondary | ICD-10-CM | POA: Diagnosis not present

## 2018-02-03 DIAGNOSIS — N401 Enlarged prostate with lower urinary tract symptoms: Secondary | ICD-10-CM | POA: Diagnosis not present

## 2018-02-03 DIAGNOSIS — Z0000 Encounter for general adult medical examination without abnormal findings: Secondary | ICD-10-CM | POA: Diagnosis not present

## 2018-02-03 DIAGNOSIS — E7800 Pure hypercholesterolemia, unspecified: Secondary | ICD-10-CM | POA: Diagnosis not present

## 2018-02-03 DIAGNOSIS — R911 Solitary pulmonary nodule: Secondary | ICD-10-CM | POA: Diagnosis not present

## 2018-02-03 DIAGNOSIS — Z1389 Encounter for screening for other disorder: Secondary | ICD-10-CM | POA: Diagnosis not present

## 2018-02-14 ENCOUNTER — Telehealth: Payer: Self-pay | Admitting: Adult Health

## 2018-02-14 NOTE — Telephone Encounter (Signed)
Pt is asking for a call from RN to discuss concerns he has re: Neuropathy.  Pt is asking if at all possible he be called before 10 am because after that time of day he is out taking care of his affairs.

## 2018-02-14 NOTE — Telephone Encounter (Signed)
Unable to get in contact or leave a voicemail  with Jim Joseph due to his phone constantly ringing. I will attempt to call back tomorrow.

## 2018-02-16 NOTE — Telephone Encounter (Signed)
Unable to get in contact with the patient due his answering machine not being available. I will attempt to call back later on today.

## 2018-03-03 DIAGNOSIS — M2550 Pain in unspecified joint: Secondary | ICD-10-CM | POA: Diagnosis not present

## 2018-03-15 ENCOUNTER — Telehealth: Payer: Self-pay | Admitting: *Deleted

## 2018-03-15 NOTE — Telephone Encounter (Signed)
Patient stopped by to request an appt.  States he has some numbness in his arms and legs.  Sometimes its on one side then its on the other.   The first available I could offer was not soon enough for him.  Please Call.  He requested a call back early in the morning please.

## 2018-03-16 NOTE — Telephone Encounter (Signed)
I contacted the pt and was able to schedule him an appt for 03/17/18 at 12 pm to discuss these issues. Pt will check in at 11:30 am.

## 2018-03-17 ENCOUNTER — Ambulatory Visit: Payer: Medicare HMO | Admitting: Neurology

## 2018-03-17 ENCOUNTER — Encounter: Payer: Self-pay | Admitting: Neurology

## 2018-03-17 VITALS — BP 140/65 | HR 61 | Ht 65.0 in | Wt 147.0 lb

## 2018-03-17 DIAGNOSIS — G5603 Carpal tunnel syndrome, bilateral upper limbs: Secondary | ICD-10-CM

## 2018-03-17 DIAGNOSIS — E538 Deficiency of other specified B group vitamins: Secondary | ICD-10-CM | POA: Diagnosis not present

## 2018-03-17 DIAGNOSIS — G4489 Other headache syndrome: Secondary | ICD-10-CM | POA: Diagnosis not present

## 2018-03-17 DIAGNOSIS — R202 Paresthesia of skin: Secondary | ICD-10-CM

## 2018-03-17 NOTE — Progress Notes (Addendum)
Reason for visit: Headache  Jim Joseph is an 85 y.o. male  History of present illness:  Jim Joseph is an 85 year old right-handed black male with a history of bilateral carpal tunnel syndrome and a history of headaches.  The patient was placed on low-dose gabapentin and seemed to get good benefit with his headaches.  When last seen in November 2019, the patient really was not having many headaches at all.  The patient is a very poor historian but it appears that he may have stopped his gabapentin and he is taking the medication only as needed.  He claims that he did not know why he was taking the medication.  The patient also reports ongoing numbness in the hands, he is not using his wrist splints on a regular basis, he also reports some troubles with numbness in the feet and halfway up the legs below the knees.  The patient has some discomfort at times in the hands.  He returns for an evaluation.  Past Medical History:  Diagnosis Date  . Bilateral carpal tunnel syndrome 03/31/2017  . GERD (gastroesophageal reflux disease)   . Glaucoma   . High cholesterol     Past Surgical History:  Procedure Laterality Date  . CATARACT EXTRACTION      Family History  Problem Relation Age of Onset  . Heart disease Brother     Social history:  reports that he has never smoked. He has never used smokeless tobacco. He reports current alcohol use. He reports that he does not use drugs.    Allergies  Allergen Reactions  . Gabapentin Diarrhea    Medications:  Prior to Admission medications   Medication Sig Start Date End Date Taking? Authorizing Provider  latanoprost (XALATAN) 0.005 % ophthalmic solution Place 1 drop into both eyes at bedtime. 02/06/16  Yes [provider]  gabapentin (NEURONTIN) 300 MG capsule Take 1 capsule (300 mg total) by mouth 2 (two) times daily. Patient not taking: Reported on 03/17/2018 12/08/17   Venancio Poisson, NP    ROS:  Out of a complete 14  system review of symptoms, the patient complains only of the following symptoms, and all other reviewed systems are negative.  Excessive eating Double vision, eye pain Chills Ringing in the ears Frequency of urination Joint pain, joint swelling, back pain Memory loss, headache, numbness Depression  Blood pressure 140/65, pulse 61, height 5\' 5"  (1.651 m), weight 147 lb (66.7 kg).  Physical Exam  General: The patient is alert and cooperative at the time of the examination.  Skin: No significant peripheral edema is noted.   Neurologic Exam  Mental status: The patient is alert and oriented x 3 at the time of the examination. The patient has apparent normal recent and remote memory, with an apparently normal attention span and concentration ability.   Cranial nerves: Facial symmetry is present. Speech is normal, no aphasia or dysarthria is noted. Extraocular movements are full. Visual fields are full.  Motor: The patient has good strength in all 4 extremities.  Sensory examination: Soft touch sensation is symmetric on the face, arms, and legs.  Coordination: The patient has good finger-nose-finger and heel-to-shin bilaterally.  Gait and station: The patient has a normal gait. Tandem gait is normal. Romberg is negative. No drift is seen.  Reflexes: Deep tendon reflexes are symmetric.   Assessment/Plan:  1.  Bilateral carpal tunnel syndrome  2.  Headache syndrome  The patient has done well on the gabapentin but he has  stopped taking it daily, he is to go back to taking 300 mg twice daily.  He will try to start using his wrist splints regularly to help prevent the symptoms of the carpal tunnel syndrome, he has indicated once again he is not wished to have surgery.  Because of the numbness in the feet, we will check some further blood work to exclude a vitamin B12 deficiency.  He will follow-up in 6 months.   Jill Alexanders MD 03/17/2018 12:15 PM  [facility] [address] [place], [place] [address]  Phone [phone] Fax [phone]

## 2018-03-18 ENCOUNTER — Telehealth: Payer: Self-pay

## 2018-03-18 LAB — RPR: RPR Ser Ql: NONREACTIVE

## 2018-03-18 LAB — VITAMIN B12: VITAMIN B 12: 383 pg/mL (ref 232–1245)

## 2018-03-18 NOTE — Telephone Encounter (Signed)
I called pt and advised him of his lab results. Pt verbalized understanding of results. Pt had no questions at this time but was encouraged to call back if questions arise.

## 2018-03-18 NOTE — Telephone Encounter (Signed)
-----   Message from Kathrynn Ducking, MD sent at 03/18/2018  7:34 AM EST -----  The blood work results are unremarkable. Please call the patient. ----- Message ----- From: Lavone Neri Lab Results In Sent: 03/18/2018   5:38 AM EST To: Kathrynn Ducking, MD

## 2018-05-02 ENCOUNTER — Telehealth: Payer: Self-pay | Admitting: Adult Health

## 2018-05-02 NOTE — Telephone Encounter (Signed)
Pt called in and stated that he wants to be tested for COVID-19 I advised him he will need to follow up with his PCP, he also stated that he is still having headaches and pain behind his eyes

## 2018-05-02 NOTE — Telephone Encounter (Signed)
He was last evaluated by Dr. Jannifer Franklin. This needs to be sent to him. Thank you

## 2018-05-04 ENCOUNTER — Telehealth: Payer: Self-pay | Admitting: Neurology

## 2018-05-04 NOTE — Telephone Encounter (Signed)
I contacted the pt. He states he wanted to know if we could test him for covid 19 and I advised our office is not proving testing at this time. Pt was advised to contact PCP, but he states he no longer sees his PCP because of person reasons. I advised the ED could provide testing for him but we are recomending pt's be tested in the ED only if High risk.     Coronavirus (COVID-19) Are you at risk?  Are you at risk for the Coronavirus (COVID-19)?  To be considered HIGH RISK for Coronavirus (COVID-19), you have to meet the following criteria:  . Traveled to [place], [place], [place], [place] or [place]; or in the [place] to [place], [place], [place], or [place]; and have fever, cough, and shortness of breath within the last 2 weeks of travel OR . Been in close contact with a person diagnosed with COVID-19 within the last 2 weeks and have fever, cough, and shortness of breath . IF YOU DO NOT MEET THESE CRITERIA, YOU ARE CONSIDERED LOW RISK FOR COVID-19.  What to do if you are HIGH RISK for COVID-19?  Marland Kitchen If you are having a medical emergency, call 911. . Seek medical care right away. Before you go to a doctor's office, urgent care or emergency department, call ahead and tell them about your recent travel, contact with someone diagnosed with COVID-19, and your symptoms. You should receive instructions from your physician's office regarding next steps of care.  . When you arrive at healthcare provider, tell the healthcare staff immediately you have returned from visiting [place], [place], [place], [place] or [place]; or traveled in the [place] to [place], [place], [place], or [place]; in the last two weeks or you have been in close contact with a person diagnosed with COVID-19 in the last 2 weeks.   . Tell the health care staff about your symptoms: fever, cough and shortness of breath. . After you have been seen by a medical provider, you will be either: o Tested for (COVID-19)  and discharged home on quarantine except to seek medical care if symptoms worsen, and asked to  - Stay home and avoid contact with others until you get your results (4-5 days)  - Avoid travel on public transportation if possible (such as bus, train, or airplane) or o Sent to the Emergency Department by EMS for evaluation, COVID-19 testing, and possible admission depending on your condition and test results    Pt also advised me he has had an increase in his migraine/headaches and wanted to know what he could do for treatment.  I advised Due to current COVID 19 pandemic, our office is severely reducing in office visits for at least the next 2 weeks, in order to minimize the risk to our patients and healthcare providers.  I offered a telephone visit with Dr. Jannifer Franklin on 05/09/18 at 11 am and the pt accepted.    Pt understands that although there may be some limitations with this type of visit, we will take all precautions to reduce any security or privacy concerns.  Pt understands that this will be treated like an in office visit and we will file with pt's insurance, and there may be a patient responsible charge related to this service. Pt confirmed best call back is [phone] Pt's allergies, medications and PMH have been updated.

## 2018-05-04 NOTE — Telephone Encounter (Signed)
Pt has called asking for RN Jinny Blossom, he was told she was not available but that a message could be left for her.  Pt was asked what the call back request was for, pt stated himself.  Pt was informed that he was asked so the RN could have an idea as to what the call back request is for.  Pt was very reluctant to give any information, he simply stated the request is a call back is about him and his health.

## 2018-05-09 ENCOUNTER — Ambulatory Visit (INDEPENDENT_AMBULATORY_CARE_PROVIDER_SITE_OTHER): Payer: Medicare HMO | Admitting: Neurology

## 2018-05-09 ENCOUNTER — Other Ambulatory Visit: Payer: Self-pay

## 2018-05-09 ENCOUNTER — Encounter: Payer: Self-pay | Admitting: Neurology

## 2018-05-09 DIAGNOSIS — G4489 Other headache syndrome: Secondary | ICD-10-CM | POA: Diagnosis not present

## 2018-05-09 DIAGNOSIS — G5603 Carpal tunnel syndrome, bilateral upper limbs: Secondary | ICD-10-CM

## 2018-05-09 NOTE — Progress Notes (Signed)
     Virtual Visit via Telephone Note  I connected with Jim Joseph on 05/09/18 at 11:00 AM EDT by telephone and verified that I am speaking with the correct person using two identifiers.   I discussed the limitations, risks, security and privacy concerns of performing an evaluation and management service by telephone and the availability of in person appointments. I also discussed with the patient that there may be a patient responsible charge related to this service. The patient expressed understanding and agreed to proceed.  The patient is at home, physician in the office.   History of Present Illness: Jim Joseph is an 85 year old right-handed black male with a history of intermittent headaches.  He currently claims that the headaches are not severe, he will have occasional mild headache.  He is not taking the gabapentin on a daily basis.  The past, the gabapentin seem to help him.  He has bilateral carpal tunnel syndrome, left greater than right, initially he did not wish to see a hand surgeon for this.  He mainly is worried that he may have the COVID virus and he wants to have testing for this.  He is not having any symptoms of virus, no fevers or chills or shortness of breath or coughing.  He has not been in contact with anyone who was sick.  He now wishes to have a surgical referral for the carpal tunnel syndrome.  He is not wearing the wrist splints as he claims that it is cumbersome to do this.   Observations/Objective: On the telephone evaluation, the patient appears to be alert and cooperative, he is answering questions appropriately.  Speech is well enunciated, not aphasic or dysarthric.  Assessment and Plan: 1.  Bilateral carpal tunnel syndrome, left greater than right  2.  Intermittent headache  The patient will go back on the gabapentin if his headaches become severe.  I will make the referral to a hand surgeon for evaluation for carpal tunnel syndrome.  He will follow-up  here for his next scheduled visit in November 2020.  Follow Up Instructions: Visit with nurse practitioner in November 2020.   I discussed the assessment and treatment plan with the patient. The patient was provided an opportunity to ask questions and all were answered. The patient agreed with the plan and demonstrated an understanding of the instructions.   The patient was advised to call back or seek an in-person evaluation if the symptoms worsen or if the condition fails to improve as anticipated.  I provided 15 minutes of non-face-to-face time during this encounter.   Kathrynn Ducking, MD

## 2018-05-19 IMAGING — CT CT CHEST W/O CM
1 series · 15 of 33 positions shown, 19 images · non-contrast
Comparison: CT abdomen 01/10/2015

CLINICAL DATA: Pulmonary nodules

EXAM:
CT CHEST WITHOUT CONTRAST
TECHNIQUE: Multidetector CT imaging of the chest was performed following the
standard protocol without IV contrast.

[Series 2: chest w/(date) · axial · 0.70mm/px · z∈[-204,+18]mm · 15 of 131 slices shown, 19 images]
[im 10/131  mediastinal]
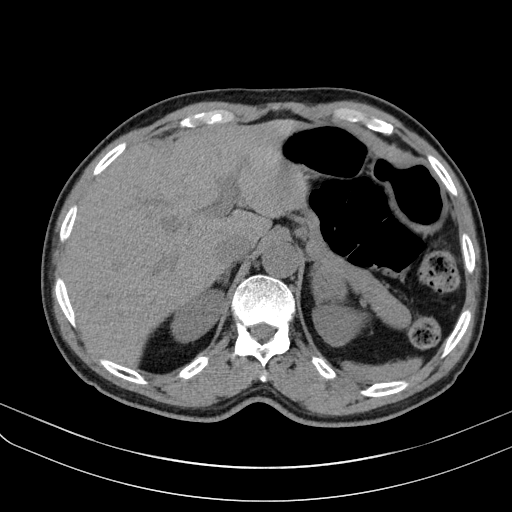
[im 10/131  lung]
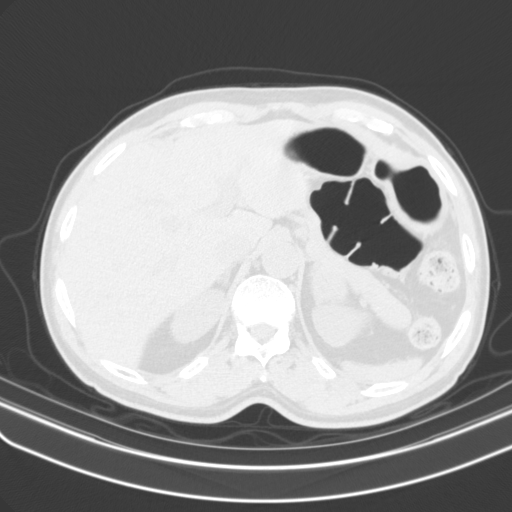
[im 20/131  lung]
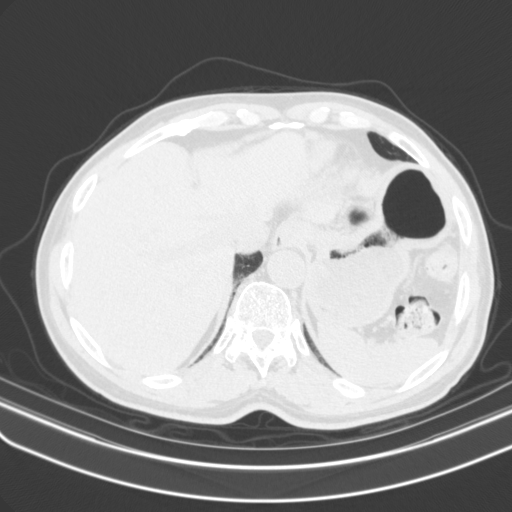
[im 27/131  lung]
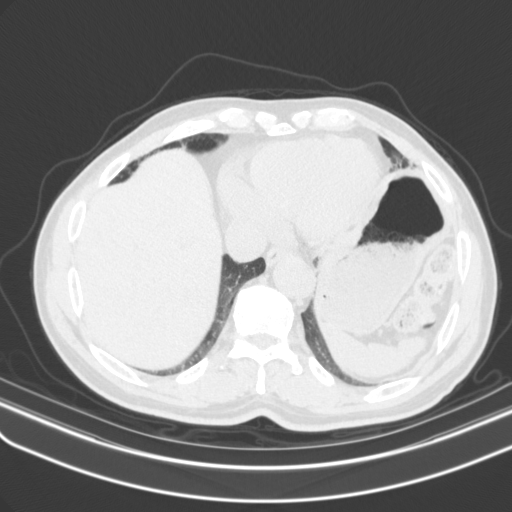
[im 34/131  lung]
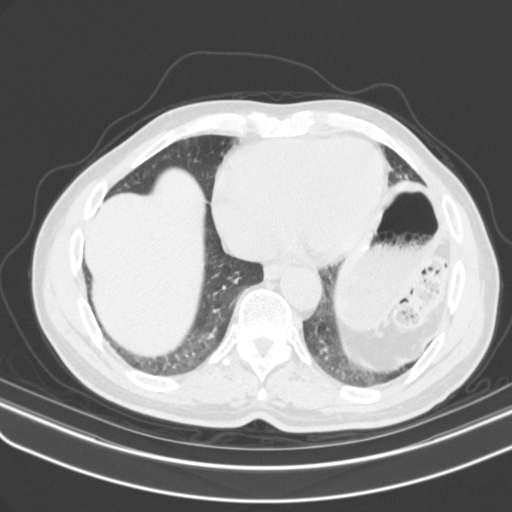
[im 44/131  mediastinal]
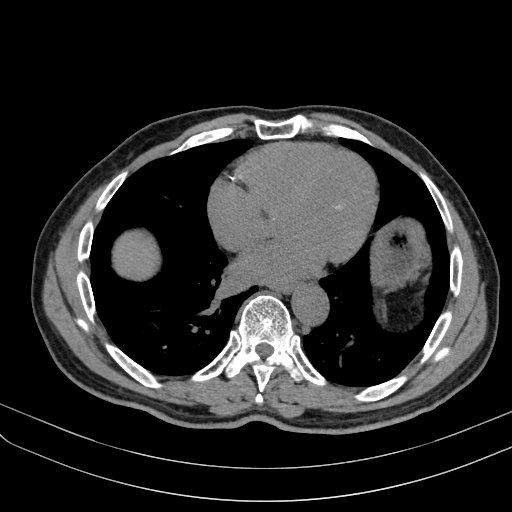
[im 44/131  lung]
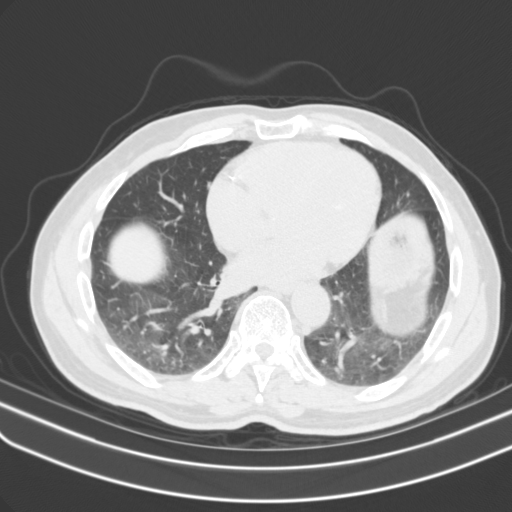
[im 53/131  lung]
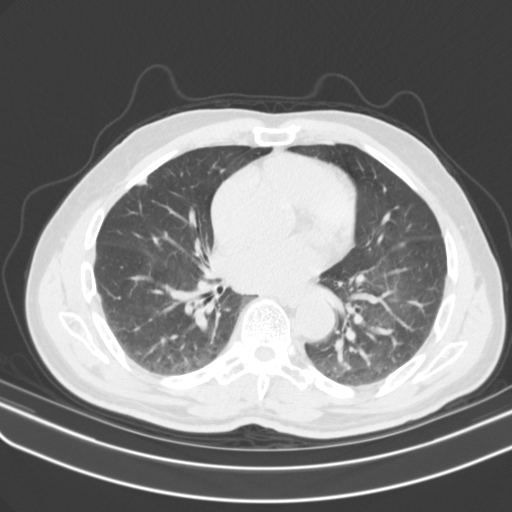
[im 61/131  lung]
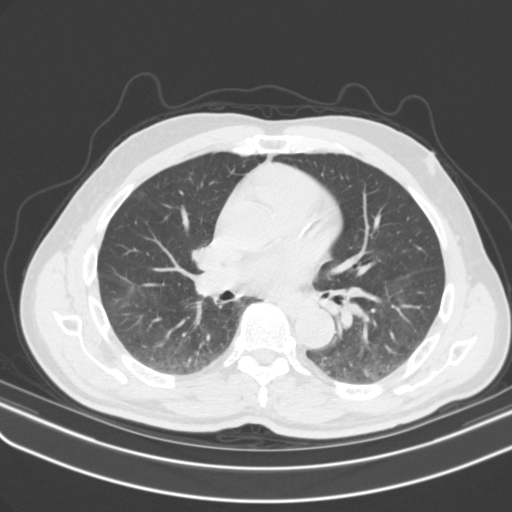
[im 68/131  lung]
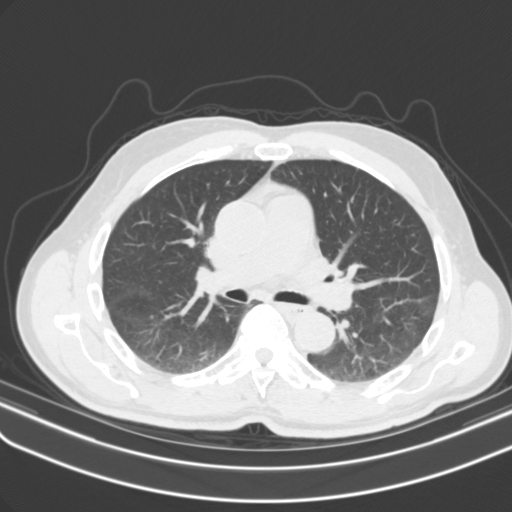
[im 73/131  mediastinal]
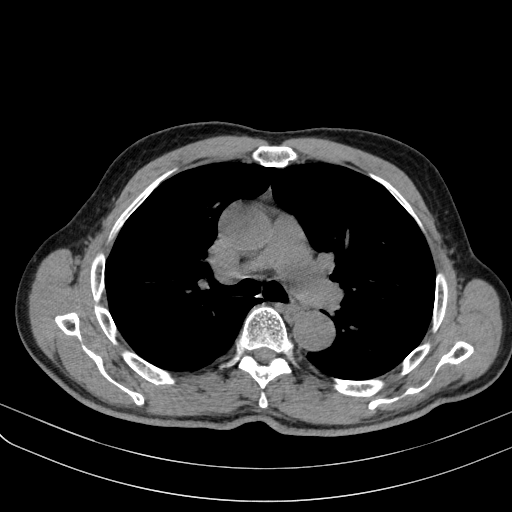
[im 73/131  lung]
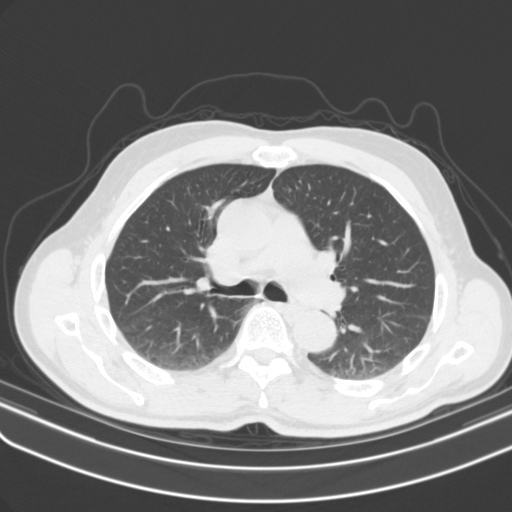
[im 79/131  lung]
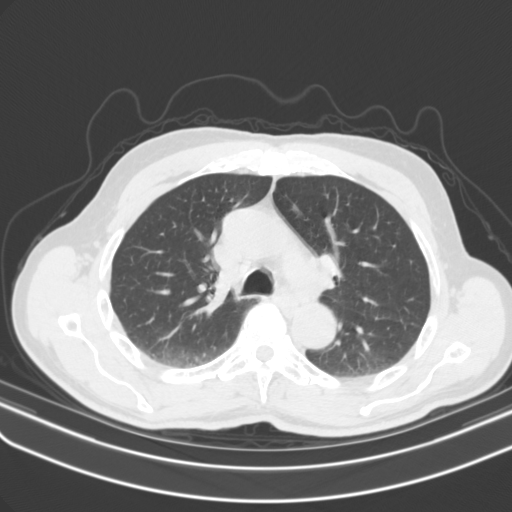
[im 87/131  lung]
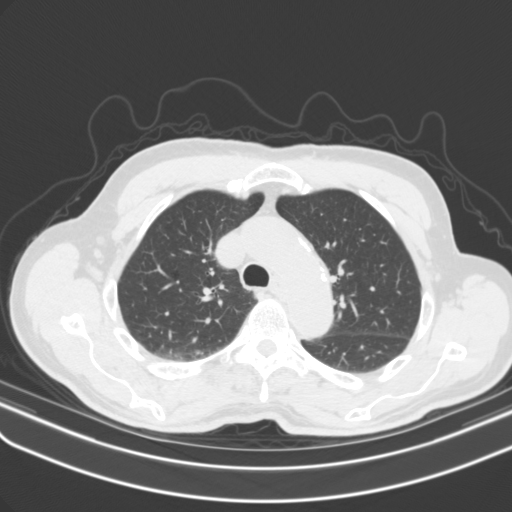
[im 97/131  lung]
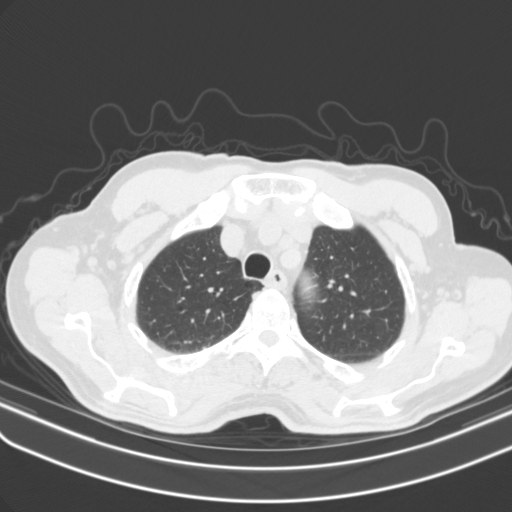
[im 105/131  mediastinal]
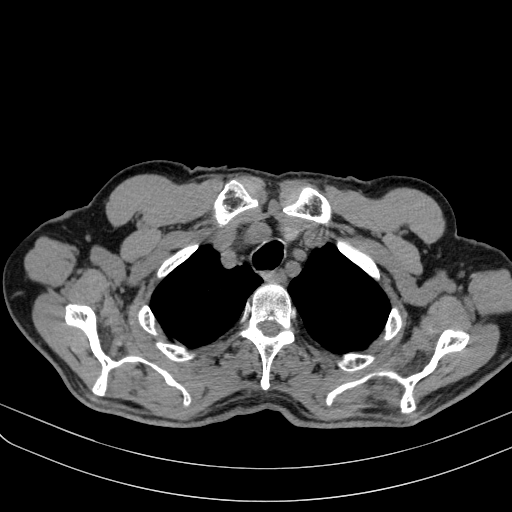
[im 105/131  lung]
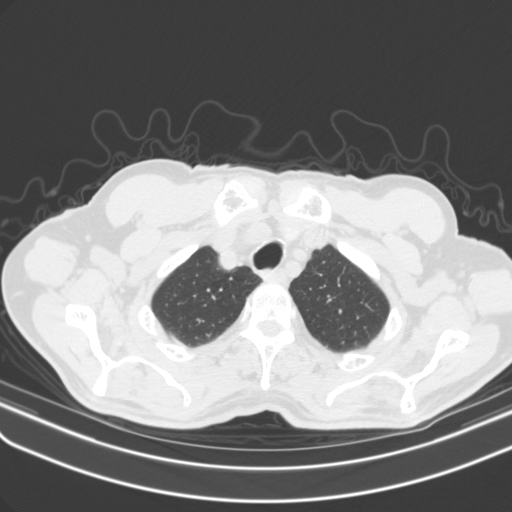
[im 111/131  lung]
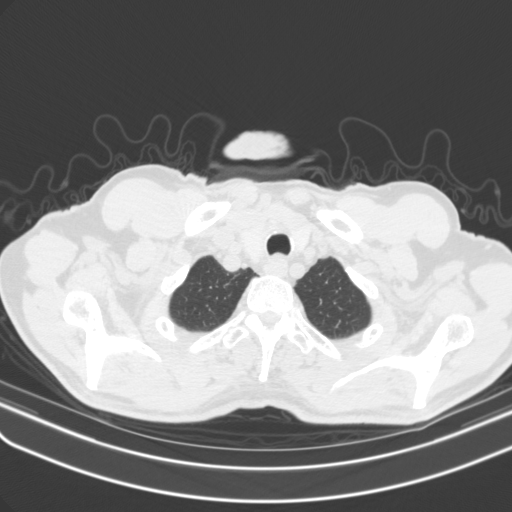
[im 121/131  lung]
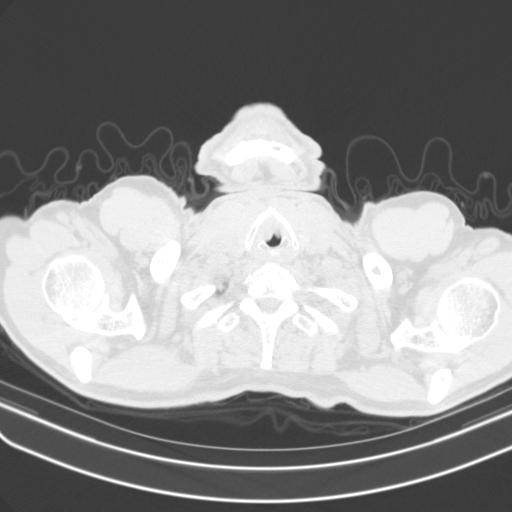

[15 of 33 positions shown; findings below may reference images not displayed]

FINDINGS: Cardiovascular: Coronary and aortic atheromatous calcifications.
Small ductus diverticulum from the distal arch. No aortic aneurysm.

Mediastinum/Nodes: No enlarged mediastinal or axillary lymph nodes.
Thyroid gland, trachea, and esophagus demonstrate no significant
findings.

Lungs/Pleura: Dependent subsegmental atelectasis posteriorly in both
lungs.

Stable 9 mm pleural based nodule, right middle lobe image 78/5. On
image 77 there is a adjacent subpleural right middle lobe 3 mm
nodule, stable. No new pulmonary nodule or focal airspace
consolidation.

Upper Abdomen: No acute abnormality.

Musculoskeletal: Anterior vertebral endplate spurring at multiple
levels in the mid thoracic spine. Schmorl's nodes in the lower
thoracic spine. Subchondral cysts or geodes in the visualized right
humeral head. Negative for fracture or other acute bone abnormality.
IMPRESSION: 1. Stable right middle lobe pulmonary nodules. Follow-up
Non-contrast chest CT at 18-24 months (from today's scan) is
considered optional for low-risk patients, but is recommended for
high-risk patients. This recommendation follows the consensus
statement: Guidelines for Management of Incidental Pulmonary Nodules
Detected on CT Images: From the [HOSPITAL] 2504; Radiology

## 2018-05-30 ENCOUNTER — Telehealth: Payer: Self-pay | Admitting: Adult Health

## 2018-05-30 NOTE — Telephone Encounter (Signed)
I called patient.  The patient is not had any symptoms of the COVID viral infection.  No indication for testing.  He does have some headaches, indicated that the headaches were severe he is to go back on his gabapentin.

## 2018-05-30 NOTE — Telephone Encounter (Signed)
Dr. Jannifer Franklin patient is having a Headache and he having and need's a PCP and he feels he needs he needs to be check for Covid . Patient states his eyes are hurting .

## 2018-06-20 DIAGNOSIS — H401132 Primary open-angle glaucoma, bilateral, moderate stage: Secondary | ICD-10-CM | POA: Diagnosis not present

## 2018-07-21 DIAGNOSIS — M25511 Pain in right shoulder: Secondary | ICD-10-CM | POA: Diagnosis not present

## 2018-07-21 DIAGNOSIS — M75122 Complete rotator cuff tear or rupture of left shoulder, not specified as traumatic: Secondary | ICD-10-CM | POA: Diagnosis not present

## 2018-07-21 DIAGNOSIS — G5603 Carpal tunnel syndrome, bilateral upper limbs: Secondary | ICD-10-CM | POA: Diagnosis not present

## 2018-07-21 DIAGNOSIS — M25512 Pain in left shoulder: Secondary | ICD-10-CM | POA: Diagnosis not present

## 2018-08-02 DIAGNOSIS — G5603 Carpal tunnel syndrome, bilateral upper limbs: Secondary | ICD-10-CM | POA: Diagnosis not present

## 2018-09-05 DIAGNOSIS — Z20828 Contact with and (suspected) exposure to other viral communicable diseases: Secondary | ICD-10-CM | POA: Diagnosis not present

## 2018-09-28 ENCOUNTER — Other Ambulatory Visit: Payer: Self-pay

## 2018-09-28 DIAGNOSIS — Z20822 Contact with and (suspected) exposure to covid-19: Secondary | ICD-10-CM

## 2018-09-29 LAB — NOVEL CORONAVIRUS, NAA: SARS-CoV-2, NAA: NOT DETECTED

## 2018-10-21 DIAGNOSIS — H401132 Primary open-angle glaucoma, bilateral, moderate stage: Secondary | ICD-10-CM | POA: Diagnosis not present

## 2018-10-21 DIAGNOSIS — Z961 Presence of intraocular lens: Secondary | ICD-10-CM | POA: Diagnosis not present

## 2018-10-21 DIAGNOSIS — H0016 Chalazion left eye, unspecified eyelid: Secondary | ICD-10-CM | POA: Diagnosis not present

## 2018-11-08 ENCOUNTER — Encounter: Payer: Self-pay | Admitting: Adult Health

## 2018-11-08 ENCOUNTER — Telehealth: Payer: Self-pay | Admitting: Adult Health

## 2018-11-08 NOTE — Telephone Encounter (Signed)
I called patient regarding rescheduling 11/27 appointment due to office closed on Fridays. Patient did not answer and I was unable to LVM. I have sent patient a letter regarding rescheduling this appointment.

## 2018-11-17 ENCOUNTER — Other Ambulatory Visit: Payer: Self-pay

## 2018-11-17 DIAGNOSIS — Z20822 Contact with and (suspected) exposure to covid-19: Secondary | ICD-10-CM

## 2018-11-18 LAB — NOVEL CORONAVIRUS, NAA: SARS-CoV-2, NAA: NOT DETECTED

## 2018-12-05 DIAGNOSIS — M5481 Occipital neuralgia: Secondary | ICD-10-CM | POA: Diagnosis not present

## 2018-12-05 DIAGNOSIS — M5030 Other cervical disc degeneration, unspecified cervical region: Secondary | ICD-10-CM | POA: Diagnosis not present

## 2018-12-05 DIAGNOSIS — Z008 Encounter for other general examination: Secondary | ICD-10-CM | POA: Diagnosis not present

## 2018-12-05 DIAGNOSIS — H269 Unspecified cataract: Secondary | ICD-10-CM | POA: Diagnosis not present

## 2018-12-05 DIAGNOSIS — Z0000 Encounter for general adult medical examination without abnormal findings: Secondary | ICD-10-CM | POA: Diagnosis not present

## 2018-12-05 DIAGNOSIS — H409 Unspecified glaucoma: Secondary | ICD-10-CM | POA: Diagnosis not present

## 2018-12-09 ENCOUNTER — Ambulatory Visit: Payer: Medicare HMO | Admitting: Adult Health

## 2019-01-09 ENCOUNTER — Ambulatory Visit: Payer: Medicare HMO | Admitting: Adult Health

## 2019-01-09 ENCOUNTER — Telehealth: Payer: Self-pay

## 2019-01-09 NOTE — Progress Notes (Deleted)
Reason for visit: Headache  Jim Joseph is an 85 y.o. male  History of present illness:  Jim Joseph is an 85 year old right-handed black male with a history of headaches behind the left eye.  The patient has undergone a workup previously that revealed an unremarkable carotid Doppler study, MRI of the brain showed mild to moderate small vessel disease with evidence of chronic left frontal hemorrhage at some point.  The patient was placed on gabapentin in low-dose, he called back indicating that this resulted in diarrhea.  The patient however gained benefit with the headaches on the medication.   02/02/17 visit (Dr. Jannifer Franklin): The patient returns to the office today for recurrence of the headache.  He is an extremely poor historian, he is not able to accurately relate any history regarding his headache.  Indicates that he does not believe the headache is daily in nature, he cannot give an estimate as to how frequent the headaches are and how long the headaches usually last.  The headaches are again around the left eye, he has no neck stiffness, he denies any visual changes with the headache or any nausea or vomiting.  He is complaining of difficulty sleeping at night because of urinary frequency, he has not talked to his primary doctor yet about this.  The patient indicates that he no longer has a prescription for gabapentin.  He returns to the office today for an evaluation.  Currently, he is complaining of constipation.  Indicates that he eats a lot of chocolate which may make his bowels more active.  05/11/17 visit: Patient returns today for follow-up visit.  He states his headaches have improved some since starting gabapentin.  He is tolerating gabapentin well without side effects.  He states his headaches can be either morning, afternoon or evening and continues to have these once a day and located behind his left eye.  Denies neck pain, vision changes/loss, or dizziness.  These headaches are not  debilitating as patient is able to continue out his daily activities.  Patient underwent EMG/NCV on 03/31/2017 and did show evidence of bilateral carpal tunnel syndrome and it was recommended that patient wear wrist splints regularly day and night for 2 months and then convert to wearing them every night.  His carpal tunnel symptoms has been improving with the braces.  He states he does wear these for the most part throughout the day and night but will have to remove them at times due to pain or irritation.  Also has complaints of increased urination where he is up all day and night to void.  Denies difficulty starting or painful urination but is most concerned with frequency and leaking.  He believes this is due to his prostate and wants to ensure that he does not have prostate cancer.  Advised patient to speak to PCP regarding this concern.  Patient also concerned about being told that he has had a stroke in the past by this office but when he questions other doctors, they are unable to find it in the chart.  Advised patient that MRI on 03/28/2016 did show old left frontal ICH but no acute or subacute strokes.  Update 12/08/2017: Patient is being seen today for six-month follow-up visit. After prior visit, gabapentin increased and he believes headaches have improved. He is unable to state when the last time he had a headache. He continues to wear wrist brace for carpel tunnel. Blood pressure today satisfactory at 113/63. He again has concerns regarding  being told in the past that he has had a stroke in the past but states he was never told that during that time. No further concerns at this time.  Update 03/17/2018 Dr. Jannifer Franklin: Jim Joseph is an 85 year old right-handed black male with a history of bilateral carpal tunnel syndrome and a history of headaches.  The patient was placed on low-dose gabapentin and seemed to get good benefit with his headaches.  When last seen in November 2019, the patient really was not  having many headaches at all.  The patient is a very poor historian but it appears that he may have stopped his gabapentin and he is taking the medication only as needed.  He claims that he did not know why he was taking the medication.  The patient also reports ongoing numbness in the hands, he is not using his wrist splints on a regular basis, he also reports some troubles with numbness in the feet and halfway up the legs below the knees.  The patient has some discomfort at times in the hands.  He returns for an evaluation.  Update via virtual visit 05/09/2018 Dr. Jannifer Franklin: Jim Joseph is an 85 year old right-handed black male with a history of intermittent headaches.  He currently claims that the headaches are not severe, he will have occasional mild headache.  He is not taking the gabapentin on a daily basis.  The past, the gabapentin seem to help him.  He has bilateral carpal tunnel syndrome, left greater than right, initially he did not wish to see a hand surgeon for this.  He mainly is worried that he may have the COVID virus and he wants to have testing for this.  He is not having any symptoms of virus, no fevers or chills or shortness of breath or coughing.  He has not been in contact with anyone who was sick.  He now wishes to have a surgical referral for the carpal tunnel syndrome.  He is not wearing the wrist splints as he claims that it is cumbersome to do this.  Update 01/01/2019: Jim Joseph is a 85 year old male who is being seen today for follow-up.      Past Medical History:  Diagnosis Date  . Bilateral carpal tunnel syndrome 03/31/2017  . GERD (gastroesophageal reflux disease)   . Glaucoma   . High cholesterol     Past Surgical History:  Procedure Laterality Date  . CATARACT EXTRACTION      Family History  Problem Relation Age of Onset  . Heart disease Brother     Social history:  reports that he has never smoked. He has never used smokeless tobacco. He reports current  alcohol use. He reports that he does not use drugs.    Allergies  Allergen Reactions  . Gabapentin Diarrhea    Medications:  Prior to Admission medications   Medication Sig Start Date End Date Taking? Authorizing Provider  COD LIVER OIL PO Take 1 capsule by mouth daily.   Yes [provider]  gabapentin (NEURONTIN) 100 MG capsule One capsule in the morning and 2 in the evening. 03/29/16  Yes Kathrynn Ducking, MD  latanoprost (XALATAN) 0.005 % ophthalmic solution Place 1 drop into both eyes at bedtime. 02/06/16  Yes [provider]    ROS:  Out of a complete 14 system review of symptoms, the patient complains only of the following symptoms, and all other reviewed systems are negative.  Ringing in ears, eye redness, incontinence of bladder, frequency of urination,  joint pain, joint swelling, moles, and numbness   There were no vitals taken for this visit.  Physical Exam  General: The patient is alert and cooperative at the time of the examination.  Skin: No significant peripheral edema is noted.   Neurologic Exam  Mental status: The patient is alert and oriented x 3 at the time of the examination. The patient has apparent normal recent and remote memory, with an apparently normal attention span and concentration ability.  Cranial nerves: Facial symmetry is present. Speech is normal, no aphasia or dysarthria is noted. Extraocular movements are full. Visual fields are full.  Motor: The patient has good strength in all 4 extremities.  Sensory examination: Soft touch sensation is symmetric on the face, arms, and legs.  Coordination: The patient has good finger-nose-finger and heel-to-shin bilaterally.  Gait and station: The patient has a normal gait. Tandem gait is normal. Romberg is negative. No drift is seen.  Reflexes: Deep tendon reflexes are symmetric.    NCV with EMG 03/31/2017 IMPRESSION: Nerve conduction studies done on both upper extremities  shows evidence of bilateral carpal tunnel syndrome of moderate severity on the left and mild severity on the right.  EMG evaluation of the left upper extremity was unremarkable with exception of some chronic stable denervation of the left APB muscle consistent with carpal tunnel syndrome.  No evidence of an overlying cervical radiculopathy was seen.     Assessment/Plan:  1.  Left periorbital headache  As patient has been stable on gabapentin 300 mg twice daily, we will continue current dose -refill provided  2.  Bilateral carpal tunnel syndrome  Patient currently wearing a carpal tunnel brace but typically will wear this at night only.  Advised him to limit daytime use unless painful or doing activity with repetitive wrist motions.   Patient will follow-up in 1 years time or call earlier with questions, concerns or need a sooner follow-up appointment    Greater than 50% time during this 25 minute consultation visit was spent on counseling and coordination of care about headache and carpal tunnel syndrome, (risk factors), and answering questions.    Frann Rider, AGNP-BC  [facility] [address] [place], [place] [address]  Phone [phone] Fax [phone]

## 2019-01-09 NOTE — Telephone Encounter (Signed)
Patient was a no call/no show for their appointment today.   

## 2019-01-11 ENCOUNTER — Encounter: Payer: Self-pay | Admitting: Adult Health

## 2019-03-13 ENCOUNTER — Telehealth: Payer: Self-pay | Admitting: Adult Health

## 2019-03-13 NOTE — Telephone Encounter (Signed)
I called pt and he is asking about his medical hx, eye gtts. He cannot call Dr. Patrici Ranks office and ask them, I offered to have his family member to help him or call pcp to assist.  We see for headaches. We do no prescribe eye drops.  He thanked for help.  I relayed that we see for headaches, last 01-09-19 he did not show for appt.

## 2019-03-13 NOTE — Telephone Encounter (Signed)
Pt called needing to speak to RN about his latanoprost (XALATAN) 0.005 % ophthalmic solution Pt states that he has to go out right now so he does not get caught out at night so pt has requested to be called tomorrow morning before noon. Please advise.

## 2019-10-09 ENCOUNTER — Telehealth: Payer: Self-pay | Admitting: Neurology

## 2019-10-09 NOTE — Telephone Encounter (Signed)
Hey, there was no message with this? What did the pt need?

## 2019-10-10 ENCOUNTER — Ambulatory Visit: Payer: Medicare HMO | Admitting: Adult Health

## 2019-10-16 DIAGNOSIS — R519 Headache, unspecified: Secondary | ICD-10-CM | POA: Diagnosis not present

## 2019-10-16 DIAGNOSIS — H401122 Primary open-angle glaucoma, left eye, moderate stage: Secondary | ICD-10-CM | POA: Diagnosis not present

## 2019-10-16 DIAGNOSIS — Z961 Presence of intraocular lens: Secondary | ICD-10-CM | POA: Diagnosis not present

## 2019-10-16 DIAGNOSIS — H401111 Primary open-angle glaucoma, right eye, mild stage: Secondary | ICD-10-CM | POA: Diagnosis not present

## 2019-10-30 DIAGNOSIS — H0014 Chalazion left upper eyelid: Secondary | ICD-10-CM | POA: Diagnosis not present

## 2020-01-17 ENCOUNTER — Encounter: Payer: Self-pay | Admitting: Neurology

## 2020-01-17 ENCOUNTER — Other Ambulatory Visit: Payer: Self-pay

## 2020-01-17 ENCOUNTER — Ambulatory Visit: Payer: Medicare HMO | Admitting: Neurology

## 2020-01-17 VITALS — BP 127/73 | HR 49 | Ht 65.0 in | Wt 140.4 lb

## 2020-01-17 DIAGNOSIS — G4489 Other headache syndrome: Secondary | ICD-10-CM | POA: Diagnosis not present

## 2020-01-17 DIAGNOSIS — G5603 Carpal tunnel syndrome, bilateral upper limbs: Secondary | ICD-10-CM

## 2020-01-17 NOTE — Progress Notes (Signed)
Reason for visit: Headache, carpal tunnel syndrome  Jim Joseph is an 87 y.o. male  History of present illness:  Jim Joseph is an 87 year old right-handed black male with a history of bilateral carpal tunnel syndrome and intermittent headaches.  In the past, he has gotten good improvement with the gabapentin.  The headaches may come and go, he has not currently been taking gabapentin.  He was sent over here from his ophthalmologist as he was complaining of headaches off and on.  The patient had cataract surgery he claims about 2 years ago.  He states that he is not taking gabapentin currently, and he is not currently having any headaches either.  He is mainly bothered by some numbness in the left hand, occasionally he will have numbness in the right hand.  In the past he has not wanted to pursue a surgical referral for his bilateral carpal tunnel syndrome.  He denies any other issues such as neck pain or weakness of the arms or legs or any balance changes.  He denies issues controlling the bowels or the bladder.   Past Medical History:  Diagnosis Date  . Bilateral carpal tunnel syndrome 03/31/2017  . GERD (gastroesophageal reflux disease)   . Glaucoma   . High cholesterol     Past Surgical History:  Procedure Laterality Date  . CATARACT EXTRACTION      Family History  Problem Relation Age of Onset  . Heart disease Brother     Social history:  reports that he has never smoked. He has never used smokeless tobacco. He reports current alcohol use. He reports that he does not use drugs.    Allergies  Allergen Reactions  . Gabapentin Diarrhea    Medications:  Prior to Admission medications   Medication Sig Start Date End Date Taking? Authorizing Provider  latanoprost (XALATAN) 0.005 % ophthalmic solution Place 1 drop into both eyes at bedtime. 02/06/16  Yes [provider]    ROS:  Out of a complete 14 system review of symptoms, the patient complains only of the  following symptoms, and all other reviewed systems are negative.  Hand numbness Intermittent headache  Blood pressure 127/73, pulse (!) 49, height 5\' 5"  (1.651 m), weight 140 lb 6.4 oz (63.7 kg).  Physical Exam  General: The patient is alert and cooperative at the time of the examination.  Skin: No significant peripheral edema is noted.   Neurologic Exam  Mental status: The patient is alert and oriented x 3 at the time of the examination. The patient has apparent normal recent and remote memory, with an apparently normal attention span and concentration ability.   Cranial nerves: Facial symmetry is present. Speech is normal, no aphasia or dysarthria is noted. Extraocular movements are full. Visual fields are full.  Motor: The patient has good strength in all 4 extremities.  Sensory examination: Soft touch sensation is symmetric on the face, arms, and legs.  Coordination: The patient has good finger-nose-finger and heel-to-shin bilaterally.  Tinel's sign is positive on the left, negative on the right wrist.  Gait and station: The patient has a normal gait. Tandem gait is normal. Romberg is negative. No drift is seen.  Reflexes: Deep tendon reflexes are symmetric.   Assessment/Plan:  1.  Bilateral carpal tunnel syndrome  2.  History of intermittent headache  The patient currently is not having headache, he is not on his gabapentin, he will call if his headaches return.  If he desires to have a  surgical referral for the carpal tunnel syndrome he will let me know.  Jim Alexanders MD 01/17/2020 12:10 PM  [facility] [address] [place],  [address]  Phone [phone] Fax [phone]

## 2020-01-18 ENCOUNTER — Telehealth: Payer: Self-pay | Admitting: Neurology

## 2020-01-18 DIAGNOSIS — G5603 Carpal tunnel syndrome, bilateral upper limbs: Secondary | ICD-10-CM

## 2020-01-18 NOTE — Telephone Encounter (Signed)
Pt. states he received a letter in the mail concerning neurotherapy & he is wanting to know what does Dr. think about him using this for his hand. Please advise.

## 2020-01-18 NOTE — Telephone Encounter (Signed)
Called with no answer.  Will try again next business day.

## 2020-01-19 NOTE — Telephone Encounter (Signed)
I returned the call to the patient. He is not sure that he wants to move w/ surgery but would like to speak to a hand surgeon about his options.

## 2020-01-19 NOTE — Addendum Note (Signed)
Addended by: Kathrynn Ducking on: 01/19/2020 09:42 AM   Modules accepted: Orders

## 2020-01-19 NOTE — Telephone Encounter (Signed)
I called the patient.  I will set up referral to Dr. Fredna Dow.  It is possible that may be able to do steroid injections into the nerve without surgery.

## 2020-02-14 DIAGNOSIS — I10 Essential (primary) hypertension: Secondary | ICD-10-CM | POA: Diagnosis not present

## 2020-02-20 DIAGNOSIS — D485 Neoplasm of uncertain behavior of skin: Secondary | ICD-10-CM | POA: Diagnosis not present

## 2020-12-25 ENCOUNTER — Ambulatory Visit: Payer: Medicare HMO | Admitting: Psychiatry

## 2021-01-08 ENCOUNTER — Encounter: Payer: Self-pay | Admitting: Neurology

## 2021-01-08 ENCOUNTER — Ambulatory Visit: Payer: Medicare HMO | Admitting: Neurology

## 2021-03-13 ENCOUNTER — Other Ambulatory Visit: Payer: Self-pay

## 2021-03-13 ENCOUNTER — Ambulatory Visit: Payer: Medicare HMO | Admitting: Neurology

## 2021-03-13 ENCOUNTER — Encounter: Payer: Self-pay | Admitting: Neurology

## 2021-03-13 VITALS — BP 122/58 | HR 55 | Ht 65.0 in | Wt 136.5 lb

## 2021-03-13 DIAGNOSIS — G4489 Other headache syndrome: Secondary | ICD-10-CM | POA: Diagnosis not present

## 2021-03-13 DIAGNOSIS — R413 Other amnesia: Secondary | ICD-10-CM

## 2021-03-13 MED ORDER — DONEPEZIL HCL 5 MG PO TABS: 5.0000 mg | ORAL_TABLET | Freq: Every day | ORAL | 3 refills | Status: AC
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
## 2021-03-15 LAB — THYROID PANEL WITH TSH
Free Thyroxine Index: 1.2 (ref 1.2–4.9)
T3 Uptake Ratio: 21 % — ABNORMAL LOW (ref 24–39)
T4, Total: 5.8 ug/dL (ref 4.5–12.0)
TSH: 1.69 u[IU]/mL (ref 0.450–4.500)

## 2021-03-15 LAB — VITAMIN B12: Vitamin B-12: 199 pg/mL — ABNORMAL LOW (ref 232–1245)

## 2021-03-15 LAB — FLUORESCENT TREPONEMAL AB(FTA)-IGG-BLD: Fluorescent Treponemal Ab, IgG: NONREACTIVE

## 2021-03-15 LAB — RPR: RPR Ser Ql: NONREACTIVE

## 2021-03-17 ENCOUNTER — Telehealth: Payer: Self-pay | Admitting: *Deleted

## 2021-03-17 ENCOUNTER — Telehealth: Payer: Self-pay | Admitting: Neurology

## 2021-03-17 ENCOUNTER — Encounter: Payer: Self-pay | Admitting: *Deleted

## 2021-03-17 NOTE — Telephone Encounter (Signed)
Called and spoke w/ pt about labs. He requested I mail letter so he has this in writing. I mailed letter w/ results.  ?

## 2021-03-17 NOTE — Telephone Encounter (Signed)
Mcarthur Rossetti Josem Kaufmann: [phone] (exp. 03/17/21 to 04/16/21) order sent to GI, they will reach out to the patient to schedule.  ?

## 2021-03-17 NOTE — Telephone Encounter (Signed)
-----   Message from Britt Bottom, MD sent at 03/15/2021 10:28 AM EST ----- ?Vitamin B 12 was mildly low   he can take 1000 mcg pills OTC   Other labs were fine ?

## 2021-03-26 ENCOUNTER — Other Ambulatory Visit: Payer: Medicare HMO

## 2021-07-04 ENCOUNTER — Encounter: Payer: Self-pay | Admitting: Diagnostic Neuroimaging

## 2021-07-04 ENCOUNTER — Ambulatory Visit: Payer: Medicare HMO | Admitting: Diagnostic Neuroimaging

## 2021-07-04 VITALS — BP 108/55 | HR 55 | Ht 63.0 in | Wt 130.8 lb

## 2021-07-04 DIAGNOSIS — F03B0 Unspecified dementia, moderate, without behavioral disturbance, psychotic disturbance, mood disturbance, and anxiety: Secondary | ICD-10-CM

## 2021-07-07 ENCOUNTER — Telehealth: Payer: Self-pay | Admitting: *Deleted

## 2021-07-07 DIAGNOSIS — F03B0 Unspecified dementia, moderate, without behavioral disturbance, psychotic disturbance, mood disturbance, and anxiety: Secondary | ICD-10-CM

## 2021-07-07 NOTE — Telephone Encounter (Signed)
Called sister again, phone immediately rang busy.

## 2021-07-07 NOTE — Telephone Encounter (Signed)
Called sister Fulton Mole, phone immediately rang busy.

## 2021-07-08 NOTE — Telephone Encounter (Signed)
Called sister, Fulton Mole and discussed with her Dr Richrd Humbles concerns, recommendations for her brother. She offers to help him, have his niece help but he has refused. She stated he will not listen to her. He often won't answer the phone. There's no other family except his daughter. She'll call daughter, Emi Belfast to discuss. Daughter lives in [place]. I gave her our #. She verbalized understanding, appreciation.

## 2021-07-09 ENCOUNTER — Telehealth: Payer: Self-pay | Admitting: Diagnostic Neuroimaging

## 2021-07-09 NOTE — Telephone Encounter (Signed)
[facility] is taking this patient. 

## 2022-01-13 ENCOUNTER — Encounter: Payer: Self-pay | Admitting: Diagnostic Neuroimaging

## 2022-01-13 ENCOUNTER — Ambulatory Visit: Payer: Medicare HMO | Admitting: Diagnostic Neuroimaging

## 2023-01-07 ENCOUNTER — Ambulatory Visit (HOSPITAL_COMMUNITY): Payer: Medicare HMO

## 2023-01-07 ENCOUNTER — Encounter (HOSPITAL_COMMUNITY): Payer: Self-pay | Admitting: Emergency Medicine

## 2023-01-07 ENCOUNTER — Ambulatory Visit (HOSPITAL_COMMUNITY)
Admission: EM | Admit: 2023-01-07 | Discharge: 2023-01-07 | Disposition: A | Discharge status: Home / Self Care | Payer: Medicare HMO | Attending: Internal Medicine | Admitting: Internal Medicine

## 2023-01-07 DIAGNOSIS — S62514A Nondisplaced fracture of proximal phalanx of right thumb, initial encounter for closed fracture: Secondary | ICD-10-CM

## 2023-01-07 MED ORDER — IBUPROFEN 600 MG PO TABS: 600.0000 mg | ORAL_TABLET | Freq: Four times a day (QID) | ORAL | 0 refills | Status: AC | PRN

## 2023-01-07 NOTE — Discharge Instructions (Addendum)
Apply a thumb spica.  Rest and elevate the affected painful area.   Apply cold compresses intermittently as needed.   Please take Ibuprofen as needed for pain As pain recedes, begin normal activities slowly as tolerated.   Please return to urgent care if symptoms persist.

## 2023-01-07 NOTE — ED Triage Notes (Signed)
Pt presents after a fall. Right hand is swollen and sore.

## 2023-01-07 NOTE — ED Provider Notes (Signed)
[facility]    CSN: [id number] Arrival date & time: 01/07/23  1418      History   Chief Complaint Chief Complaint  Patient presents with   Fall    HPI Jim Joseph is a 89 y.o. male comes to the urgent care with right thumb pain.  Patient appeared to have sustained a fall a few days ago.  His daughter encouraged her to come to the urgent care.  The right thumb is swollen, bruised and tender to touch.  There is no known relieving factors.  Patient has been applying alcohol to the right stump.  No numbness or tingling.  HPI  Past Medical History:  Diagnosis Date   Bilateral carpal tunnel syndrome 03/31/2017   GERD (gastroesophageal reflux disease)    Glaucoma    High cholesterol     Patient Active Problem List   Diagnosis Date Noted   Bilateral carpal tunnel syndrome 03/31/2017   Headache syndrome 03/10/2016   Tonsil, abscess 10/11/2011    Class: Acute    Past Surgical History:  Procedure Laterality Date   CATARACT EXTRACTION         Home Medications    Prior to Admission medications   Medication Sig Start Date End Date Taking? Authorizing Provider  ibuprofen (ADVIL) 600 MG tablet Take 1 tablet (600 mg total) by mouth every 6 (six) hours as needed. 01/07/23  Yes Roylee Chaffin, Britta Mccreedy, MD  donepezil (ARICEPT) 5 MG tablet Take 1 tablet (5 mg total) by mouth at bedtime. 03/13/21   Sater, Pearletha Furl, MD  mupirocin ointment (BACTROBAN) 2 % SMARTSIG:1 Application Topical 2-3 Times Daily 07/01/21   [provider]    Family History Family History  Problem Relation Age of Onset   Heart disease Brother    Carpal tunnel syndrome Neg Hx     Social History Social History   Tobacco Use   Smoking status: Never   Smokeless tobacco: Never  Substance Use Topics   Alcohol use: Not Currently   Drug use: No     Allergies   Gabapentin   Review of Systems Review of Systems As per HPI  Physical Exam Triage Vital Signs ED Triage Vitals [01/07/23  1434]  Encounter Vitals Group     BP 121/75     Systolic BP Percentile      Diastolic BP Percentile      Pulse Rate (!) 109     Resp 17     Temp 97.8 F (36.6 C)     Temp Source Oral     SpO2 99 %     Weight      Height      Head Circumference      Peak Flow      Pain Score 2     Pain Loc      Pain Education      Exclude from Growth Chart    No data found.  Updated Vital Signs BP 121/75 (BP Location: Right Arm)   Pulse (!) 109   Temp 97.8 F (36.6 C) (Oral)   Resp 17   SpO2 99%   Visual Acuity Right Eye Distance:   Left Eye Distance:   Bilateral Distance:    Right Eye Near:   Left Eye Near:    Bilateral Near:     Physical Exam Vitals and nursing note reviewed.  Constitutional:      General: He is not in acute distress.    Appearance: He is not  ill-appearing.  Musculoskeletal:        General: Swelling and deformity present. Normal range of motion.  Skin:    General: Skin is warm.     Findings: Bruising present.  Neurological:     Mental Status: He is alert.      UC Treatments / Results  Labs (all labs ordered are listed, but only abnormal results are displayed) Labs Reviewed - No data to display  EKG   Radiology DG Hand Complete Right Result Date: 01/07/2023 CLINICAL DATA:  Pain after fall EXAM: RIGHT HAND - COMPLETE 3 VIEW COMPARISON:  None Available. FINDINGS: Osteopenia. Nondisplaced oblique fracture of the proximal metaphysis of the first metacarpal. No additional fracture or dislocation. Multilevel joint space loss along the interphalangeal joints with some osteophyte formation. There also some joint space loss of the first, second and third metacarpophalangeal joints. IMPRESSION: Nondisplaced oblique fracture of the proximal metaphysis of the first metacarpal. Osteopenia.  Multifocal degenerative changes Electronically Signed   By: Karen Kays M.D.   On: 01/07/2023 15:49    Procedures Procedures (including critical care time)  Medications  Ordered in UC Medications - No data to display  Initial Impression / Assessment and Plan / UC Course  I have reviewed the triage vital signs and the nursing notes.  Pertinent labs & imaging results that were available during my care of the patient were reviewed by me and considered in my medical decision making (see chart for details).     1.  Nondisplaced fracture of the proximal phalanx of the right thumb: X-ray of the right thumb shows nondisplaced oblique fracture of the proximal metaphysis of the first metacarpal Thumb spica Ibuprofen as needed for pain RICE protocol Return precautions given. Final Clinical Impressions(s) / UC Diagnoses   Final diagnoses:  Nondisplaced fracture of proximal phalanx of right thumb, initial encounter for closed fracture     Discharge Instructions      Apply a thumb spica.  Rest and elevate the affected painful area.   Apply cold compresses intermittently as needed.   Please take Ibuprofen as needed for pain As pain recedes, begin normal activities slowly as tolerated.   Please return to urgent care if symptoms persist.      ED Prescriptions     Medication Sig Dispense Auth. Provider   ibuprofen (ADVIL) 600 MG tablet Take 1 tablet (600 mg total) by mouth every 6 (six) hours as needed. 30 tablet Camil Hausmann, Britta Mccreedy, MD      PDMP not reviewed this encounter.   Merrilee Jansky, MD 01/07/23 [phone]

## 2024-05-10 ENCOUNTER — Institutional Professional Consult (permissible substitution): Admitting: Diagnostic Neuroimaging
